# Patient Record
Sex: Male | Born: 1990 | Hispanic: Yes | Marital: Single | Smoking: Current some day smoker
Health system: Southern US, Community
[De-identification: ages and names within clinical notes are randomized; demographics above are authoritative.]

## PROBLEM LIST (undated history)

## (undated) DIAGNOSIS — Z789 Other specified health status: Secondary | ICD-10-CM

---

## 2020-05-16 ENCOUNTER — Inpatient Hospital Stay (HOSPITAL_COMMUNITY)
Admission: EM | Admit: 2020-05-16 | Discharge: 2020-05-20 | DRG: 372 | Disposition: A | Discharge status: Home / Self Care | Payer: Self-pay | Attending: Physician Assistant | Admitting: Physician Assistant

## 2020-05-16 ENCOUNTER — Emergency Department (HOSPITAL_COMMUNITY): Payer: Self-pay

## 2020-05-16 ENCOUNTER — Encounter (HOSPITAL_COMMUNITY): Payer: Self-pay | Admitting: Emergency Medicine

## 2020-05-16 DIAGNOSIS — L0291 Cutaneous abscess, unspecified: Secondary | ICD-10-CM

## 2020-05-16 DIAGNOSIS — K651 Peritoneal abscess: Secondary | ICD-10-CM

## 2020-05-16 DIAGNOSIS — K3533 Acute appendicitis with perforation and localized peritonitis, with abscess: Principal | ICD-10-CM | POA: Diagnosis present

## 2020-05-16 DIAGNOSIS — K3532 Acute appendicitis with perforation and localized peritonitis, without abscess: Secondary | ICD-10-CM | POA: Diagnosis present

## 2020-05-16 DIAGNOSIS — K659 Peritonitis, unspecified: Secondary | ICD-10-CM

## 2020-05-16 DIAGNOSIS — N179 Acute kidney failure, unspecified: Secondary | ICD-10-CM | POA: Diagnosis present

## 2020-05-16 DIAGNOSIS — K5900 Constipation, unspecified: Secondary | ICD-10-CM | POA: Diagnosis present

## 2020-05-16 DIAGNOSIS — Z20822 Contact with and (suspected) exposure to covid-19: Secondary | ICD-10-CM | POA: Diagnosis present

## 2020-05-16 DIAGNOSIS — F17200 Nicotine dependence, unspecified, uncomplicated: Secondary | ICD-10-CM | POA: Diagnosis present

## 2020-05-16 HISTORY — DX: Acute appendicitis with perforation, localized peritonitis, and gangrene, without abscess: K35.32

## 2020-05-16 LAB — CBC
HCT: 45.9 % (ref 39.0–52.0)
Hemoglobin: 15.6 g/dL (ref 13.0–17.0)
MCH: 29.2 pg (ref 26.0–34.0)
MCHC: 34 g/dL (ref 30.0–36.0)
MCV: 85.8 fL (ref 80.0–100.0)
Platelets: 317 10*3/uL (ref 150–400)
RBC: 5.35 MIL/uL (ref 4.22–5.81)
RDW: 12.6 % (ref 11.5–15.5)
WBC: 17.2 10*3/uL — ABNORMAL HIGH (ref 4.0–10.5)
nRBC: 0 % (ref 0.0–0.2)

## 2020-05-16 LAB — COMPREHENSIVE METABOLIC PANEL
ALT: 36 U/L (ref 0–44)
AST: 27 U/L (ref 15–41)
Albumin: 3.7 g/dL (ref 3.5–5.0)
Alkaline Phosphatase: 76 U/L (ref 38–126)
Anion gap: 16 — ABNORMAL HIGH (ref 5–15)
BUN: 34 mg/dL — ABNORMAL HIGH (ref 6–20)
CO2: 23 mmol/L (ref 22–32)
Calcium: 9 mg/dL (ref 8.9–10.3)
Chloride: 92 mmol/L — ABNORMAL LOW (ref 98–111)
Creatinine, Ser: 1.62 mg/dL — ABNORMAL HIGH (ref 0.61–1.24)
GFR calc Af Amer: >60 mL/min (ref >60)
GFR calc non Af Amer: 57 mL/min — ABNORMAL LOW (ref >60)
Glucose, Bld: 163 mg/dL — ABNORMAL HIGH (ref 70–99)
Potassium: 3.8 mmol/L (ref 3.5–5.1)
Sodium: 131 mmol/L — ABNORMAL LOW (ref 135–145)
Total Bilirubin: 2.4 mg/dL — ABNORMAL HIGH (ref 0.3–1.2)
Total Protein: 7.7 g/dL (ref 6.5–8.1)

## 2020-05-16 LAB — LACTIC ACID, PLASMA: Lactic Acid, Venous: 1.8 mmol/L (ref 0.5–1.9)

## 2020-05-16 LAB — LIPASE, BLOOD: Lipase: 18 U/L (ref 11–51)

## 2020-05-16 LAB — SARS CORONAVIRUS 2 BY RT PCR (HOSPITAL ORDER, PERFORMED IN ~~LOC~~ HOSPITAL LAB): SARS Coronavirus 2: NEGATIVE

## 2020-05-16 MED ORDER — HYDROMORPHONE HCL 1 MG/ML IJ SOLN
0.5000 mg | Freq: Once | INTRAMUSCULAR | Status: AC
  Administered 2020-05-16: 0.5 mg via INTRAVENOUS
  Filled 2020-05-16: qty 1

## 2020-05-16 MED ORDER — POTASSIUM CHLORIDE IN NACL 20-0.9 MEQ/L-% IV SOLN
INTRAVENOUS | Status: DC
  Filled 2020-05-16 (×9): qty 1000

## 2020-05-16 MED ORDER — LACTATED RINGERS IV BOLUS
1000.0000 mL | Freq: Once | INTRAVENOUS | Status: AC
  Administered 2020-05-16: 1000 mL via INTRAVENOUS

## 2020-05-16 MED ORDER — SODIUM CHLORIDE 0.9 % IV BOLUS
1000.0000 mL | Freq: Once | INTRAVENOUS | Status: AC
  Administered 2020-05-16: 1000 mL via INTRAVENOUS

## 2020-05-16 MED ORDER — IOHEXOL 300 MG/ML  SOLN
100.0000 mL | Freq: Once | INTRAMUSCULAR | Status: AC | PRN
  Administered 2020-05-16: 100 mL via INTRAVENOUS

## 2020-05-16 MED ORDER — OXYCODONE HCL 5 MG PO TABS: 5.0000 mg | ORAL_TABLET | ORAL | Status: DC | PRN

## 2020-05-16 MED ORDER — PANTOPRAZOLE SODIUM 40 MG IV SOLR
40.0000 mg | Freq: Every day | INTRAVENOUS | Status: DC
  Administered 2020-05-17 – 2020-05-18 (×3): 40 mg via INTRAVENOUS
  Filled 2020-05-16 (×3): qty 40

## 2020-05-16 MED ORDER — PIPERACILLIN-TAZOBACTAM 3.375 G IVPB 30 MIN
3.3750 g | Freq: Once | INTRAVENOUS | Status: AC
  Administered 2020-05-16: 3.375 g via INTRAVENOUS
  Filled 2020-05-16: qty 50

## 2020-05-16 MED ORDER — ACETAMINOPHEN 650 MG RE SUPP: 650.0000 mg | Freq: Four times a day (QID) | RECTAL | Status: DC | PRN

## 2020-05-16 MED ORDER — SODIUM CHLORIDE 0.9% FLUSH
3.0000 mL | Freq: Once | INTRAVENOUS | Status: AC
  Administered 2020-05-17: 3 mL via INTRAVENOUS

## 2020-05-16 MED ORDER — ONDANSETRON HCL 4 MG/2ML IJ SOLN: 4.0000 mg | Freq: Four times a day (QID) | INTRAMUSCULAR | Status: DC | PRN

## 2020-05-16 MED ORDER — ONDANSETRON 4 MG PO TBDP: 4.0000 mg | ORAL_TABLET | Freq: Four times a day (QID) | ORAL | Status: DC | PRN

## 2020-05-16 MED ORDER — ACETAMINOPHEN 325 MG PO TABS
650.0000 mg | ORAL_TABLET | Freq: Four times a day (QID) | ORAL | Status: DC | PRN
  Administered 2020-05-17: 650 mg via ORAL
  Filled 2020-05-16: qty 2

## 2020-05-16 MED ORDER — PIPERACILLIN-TAZOBACTAM 3.375 G IVPB
3.3750 g | Freq: Three times a day (TID) | INTRAVENOUS | Status: DC
  Administered 2020-05-17 – 2020-05-20 (×10): 3.375 g via INTRAVENOUS
  Filled 2020-05-16 (×10): qty 50

## 2020-05-16 MED ORDER — METHOCARBAMOL 500 MG PO TABS: 500.0000 mg | ORAL_TABLET | Freq: Four times a day (QID) | ORAL | Status: DC | PRN

## 2020-05-16 MED ORDER — ALBUMIN HUMAN 5 % IV SOLN
25.0000 g | Freq: Once | INTRAVENOUS | Status: AC
  Administered 2020-05-17: 25 g via INTRAVENOUS
  Filled 2020-05-16: qty 500

## 2020-05-16 MED ORDER — MORPHINE SULFATE (PF) 4 MG/ML IV SOLN
4.0000 mg | INTRAVENOUS | Status: DC | PRN
  Administered 2020-05-17 (×2): 4 mg via INTRAVENOUS
  Filled 2020-05-16 (×2): qty 1

## 2020-05-16 MED ORDER — ONDANSETRON HCL 4 MG/2ML IJ SOLN
4.0000 mg | Freq: Once | INTRAMUSCULAR | Status: AC
  Administered 2020-05-16: 4 mg via INTRAVENOUS
  Filled 2020-05-16: qty 2

## 2020-05-16 NOTE — H&P (Signed)
Jonathan Hampton is an 29 y.o. male.   Chief Complaint: R abdominal pain, vomiting HPI: 29yo M presented to the emergency department complaining of right-sided abdominal pain and vomiting for 48 hours.  The abdominal pain was initially centralized and then moved more to the right side.  He has not been able to eat or drink very much.  Evaluation in the emergency department showed tachycardia, leukocytosis of 17,200, acute kidney injury with creatinine 1.62, and CT scan of the abdomen pelvis consistent with perforated appendicitis with intra-abdominal abscess.  I was asked to see him for admission.  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  No previous surgeries.  History reviewed. No pertinent past medical history.  History reviewed. No pertinent surgical history.  No family history on file. Social History:  reports that he has been smoking. He has never used smokeless tobacco. He reports current alcohol use. He reports previous drug use.  Allergies: No Known Allergies  (Not in a hospital admission)   Results for orders placed or performed during the hospital encounter of 05/16/20 (from the past 48 hour(s))  Lipase, blood     Status: None   Collection Time: 05/16/20  7:36 PM  Result Value Ref Range   Lipase 18 11 - 51 U/L    Comment: Performed at [facility], [address], [place], [place] [address]  Comprehensive metabolic panel     Status: Abnormal   Collection Time: 05/16/20  7:36 PM  Result Value Ref Range   Sodium 131 (L) 135 - 145 mmol/L   Potassium 3.8 3.5 - 5.1 mmol/L   Chloride 92 (L) 98 - 111 mmol/L   CO2 23 22 - 32 mmol/L   Glucose, Bld 163 (H) 70 - 99 mg/dL    Comment: Glucose reference range applies only to samples taken after fasting for at least 8 hours.   BUN 34 (H) 6 - 20 mg/dL   Creatinine, Ser 0.09 (H) 0.61 - 1.24 mg/dL   Calcium 9.0 8.9 - 38.1 mg/dL   Total Protein 7.7 6.5 - 8.1 g/dL   Albumin 3.7 3.5 - 5.0 g/dL   AST 27 15 - 41  U/L   ALT 36 0 - 44 U/L   Alkaline Phosphatase 76 38 - 126 U/L   Total Bilirubin 2.4 (H) 0.3 - 1.2 mg/dL   GFR calc non Af Amer 57 (L) >60 mL/min   GFR calc Af Amer >60 >60 mL/min   Anion gap 16 (H) 5 - 15    Comment: Performed at [facility], [address] [place], [place] [address]  CBC     Status: Abnormal   Collection Time: 05/16/20  7:36 PM  Result Value Ref Range   WBC 17.2 (H) 4.0 - 10.5 K/uL   RBC 5.35 4.22 - 5.81 MIL/uL   Hemoglobin 15.6 13.0 - 17.0 g/dL   HCT 71.6 39 - 52 %   MCV 85.8 80.0 - 100.0 fL   MCH 29.2 26.0 - 34.0 pg   MCHC 34.0 30.0 - 36.0 g/dL   RDW 96.7 89.3 - 81.0 %   Platelets 317 150 - 400 K/uL   nRBC 0.0 0.0 - 0.2 %    Comment: Performed at [facility], [address], [place], [place] [address]  Lactic acid, plasma     Status: None   Collection Time: 05/16/20  8:01 PM  Result Value Ref Range   Lactic Acid, Venous  1.8 0.5 - 1.9 mmol/L    Comment: Performed at [facility], [address] [place], [place] [address]   CT ABDOMEN PELVIS W CONTRAST  Result Date: 05/16/2020 CLINICAL DATA:  Abdominal pain. EXAM: CT ABDOMEN AND PELVIS WITH CONTRAST TECHNIQUE: Multidetector CT imaging of the abdomen and pelvis was performed using the standard protocol following bolus administration of intravenous contrast. CONTRAST:  123mL OMNIPAQUE IOHEXOL 300 MG/ML  SOLN COMPARISON:  None. FINDINGS: Lower chest: There is a small right-sided pleural effusion.There is atelectasis at the lung bases. Hepatobiliary: The liver is normal. Normal gallbladder.There is no biliary ductal dilation. Pancreas: Normal contours without ductal dilatation. No peripancreatic fluid collection. Spleen: Unremarkable. Adrenals/Urinary Tract: --Adrenal glands: Unremarkable. --Right kidney/ureter: No hydronephrosis or radiopaque kidney stones. --Left kidney/ureter: No hydronephrosis or radiopaque kidney stones. --Urinary bladder: Unremarkable. Stomach/Bowel: --Stomach/Duodenum: There  is some mild diffuse wall thickening of the distal esophagus. The visualized portions of the stomach are unremarkable. --Small bowel: There are mildly dilated loops of small bowel scattered throughout the abdomen. --Colon: There is mild wall thickening of the ascending colon, likely reactive in etiology. --Appendix: There are findings of perforated acute appendicitis. There is a forming abscess along the inferior margin of the right hepatic lobe measuring approximately 8.2 by 2.8 cm (coronal series 6, image 64). There are pockets of free air in the upper abdomen. There is a small amount of free fluid in the abdomen and pelvis. Vascular/Lymphatic: Normal course and caliber of the major abdominal vessels. --No retroperitoneal lymphadenopathy. --No mesenteric lymphadenopathy. --No pelvic or inguinal lymphadenopathy. Reproductive: Unremarkable Other: Free fluid and free air is noted in the abdomen and pelvis. The abdominal wall is normal. Musculoskeletal. 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 There is no acute displaced fracture. IMPRESSION: 1. Findings consistent with perforated acute appendicitis. There is an 8 cm abscess at the inferior margin of the right hepatic lobe. There is free air and free fluid in the abdomen and pelvis. 2. Small right-sided pleural effusion with adjacent atelectasis. 3. Low-grade small-bowel ileus. 4. Wall thickening of the cecum and ascending colon, likely reactive. 5. 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 These results were called by telephone at the time of interpretation on 05/16/2020 at 9:54 pm to provider [facility] , who verbally acknowledged these results. Electronically Signed   By: Constance Holster M.D.   On: 05/16/2020 21:58   DG Chest Port 1 View  Result Date: 05/16/2020 CLINICAL DATA:  Abdominal pain and distension.  Tachycardia. EXAM: PORTABLE CHEST 1 VIEW COMPARISON:  None. FINDINGS: Shallow  lung inflation with bibasilar atelectasis. No focal airspace consolidation. No pulmonary edema or pleural effusion. Normal cardiomediastinal contours. No pneumothorax. IMPRESSION: Shallow lung inflation with bibasilar atelectasis. Electronically Signed   By: Ulyses Jarred M.D.   On: 05/16/2020 20:41    Review of Systems  Constitutional: Positive for appetite change and fatigue.  HENT: Negative.   Eyes: Negative.   Respiratory: Negative.   Cardiovascular: Negative.   Gastrointestinal: Positive for abdominal pain, nausea and vomiting. Negative for diarrhea.  Endocrine: Negative.   Musculoskeletal: Negative.   Skin: Negative.   Allergic/Immunologic: Negative.   Neurological: Negative.   Hematological: Negative.   Psychiatric/Behavioral: Negative.     Blood pressure 129/84, pulse (!) 130, temperature 98.4 F (36.9 C), temperature source Oral, resp. rate (!) 28, SpO2 94 %. Physical Exam  Vitals reviewed. Constitutional: He is oriented to person, place,  and time. He appears well-developed.  HENT:  Head: Normocephalic.  Mouth/Throat: Mucous membranes are moist.  Eyes: Pupils are equal, round, and reactive to light.  Cardiovascular: Regular rhythm and normal heart sounds. Tachycardia present.  No murmur heard. Respiratory: Effort normal and breath sounds normal. No respiratory distress. He has no wheezes. He has no rales.  GI: Bowel sounds are decreased. There is no splenomegaly or hepatomegaly. There is abdominal tenderness in the right upper quadrant and right lower quadrant. No hernia.  Neurological: He is alert and oriented to person, place, and time. He displays no weakness.  Skin: Skin is warm and dry. Capillary refill takes less than 2 seconds. No rash noted.  Psychiatric: Mood normal.     Assessment/Plan Perforated appendicitis with intra-abdominal abscess SIRS Acute kidney injury  Admit to progressive unit, Zosyn IV, NPO, further aggressive volume resuscitation, IR to drain  abscess  I discussed the plan in detail with him and answered his questions.  Liz Malady, MD 05/16/2020, 10:57 PM

## 2020-05-16 NOTE — ED Provider Notes (Signed)
She has [facility] EMERGENCY DEPARTMENT Provider Note   CSN: [id number] Arrival date & time: 05/16/20  1761     History Chief Complaint  Patient presents with  . Abdominal Pain    Jonathan Hampton is a 29 y.o. male.  Patient with no past surgical history presents to the emergency department for abdominal pain, vomiting, constipation.  History taken using video interpreter.  Patient reports 2 to 3 days of severe acid burning in his chest with associated vomiting whenever he tries to eat or drink.  He has not been able to eat any solids and whenever he tries to drink juice he vomits.  He denies blood in the vomit.  Abdominal pain is worse with movement and palpation.  It is severe at times.  Patient reports constipation.  He denies history of chronic alcohol use, heavy NSAID use.  He denies history of peptic ulcer disease.  No history of abdominal surgeries.  He denies take any medications at home.  He reports decreased urine, no hematuria or dysuria.  He states that his abdomen feels bloated.        History reviewed. No pertinent past medical history.  There are no problems to display for this patient.   History reviewed. No pertinent surgical history.     No family history on file.  Social History   Tobacco Use  . Smoking status: Current Every Day Smoker  . Smokeless tobacco: Never Used  Substance Use Topics  . Alcohol use: Yes  . Drug use: Not Currently    Home Medications Prior to Admission medications   Not on File    Allergies    Patient has no known allergies.  Review of Systems   Review of Systems  Constitutional: Negative for fever.  HENT: Negative for rhinorrhea and sore throat.   Eyes: Negative for redness.  Respiratory: Negative for cough and shortness of breath.   Cardiovascular: Negative for chest pain.  Gastrointestinal: Positive for abdominal pain, constipation, nausea and vomiting. Negative for blood in stool and  diarrhea.  Genitourinary: Negative for dysuria.  Musculoskeletal: Negative for myalgias.  Skin: Negative for rash.  Neurological: Negative for headaches.    Physical Exam Updated Vital Signs BP 124/78   Pulse (!) 159   Temp 98.4 F (36.9 C) (Oral)   Resp 18   SpO2 94%   Physical Exam Vitals and nursing note reviewed.  Constitutional:      Appearance: He is well-developed.  HENT:     Head: Normocephalic and atraumatic.  Eyes:     General:        Right eye: No discharge.        Left eye: No discharge.     Conjunctiva/sclera: Conjunctivae normal.  Cardiovascular:     Rate and Rhythm: Regular rhythm. Tachycardia present.     Heart sounds: Normal heart sounds.     Comments: HR 150-160 Pulmonary:     Effort: Pulmonary effort is normal.     Breath sounds: Normal breath sounds.  Abdominal:     Palpations: Abdomen is soft.     Tenderness: There is generalized abdominal tenderness. There is guarding and rebound. Negative signs include Murphy's sign and McBurney's sign.     Hernia: No hernia is present.  Musculoskeletal:     Cervical back: Normal range of motion and neck supple.  Skin:    General: Skin is warm and dry.  Neurological:     Mental Status: He is alert.  ED Results / Procedures / Treatments   Labs (all labs ordered are listed, but only abnormal results are displayed) Labs Reviewed  COMPREHENSIVE METABOLIC PANEL - Abnormal; Notable for the following components:      Result Value   Sodium 131 (*)    Chloride 92 (*)    Glucose, Bld 163 (*)    BUN 34 (*)    Creatinine, Ser 1.62 (*)    Total Bilirubin 2.4 (*)    GFR calc non Af Amer 57 (*)    Anion gap 16 (*)    All other components within normal limits  CBC - Abnormal; Notable for the following components:   WBC 17.2 (*)    All other components within normal limits  SARS CORONAVIRUS 2 BY RT PCR (HOSPITAL ORDER, [facility])  LIPASE, BLOOD  LACTIC ACID, PLASMA  URINALYSIS,  ROUTINE W REFLEX MICROSCOPIC    ED ECG REPORT   Date: 05/16/2020  Rate: 158  Rhythm: sinus tachycardia  QRS Axis: normal  Intervals: normal  ST/T Wave abnormalities: normal  Conduction Disutrbances:none  Narrative Interpretation:   Old EKG Reviewed: none available  I have personally reviewed the EKG tracing and agree with the computerized printout as noted.  Radiology CT ABDOMEN PELVIS W CONTRAST  Result Date: 05/16/2020 CLINICAL DATA:  Abdominal pain. EXAM: CT ABDOMEN AND PELVIS WITH CONTRAST TECHNIQUE: Multidetector CT imaging of the abdomen and pelvis was performed using the standard protocol following bolus administration of intravenous contrast. CONTRAST:  158mL OMNIPAQUE IOHEXOL 300 MG/ML  SOLN COMPARISON:  None. FINDINGS: Lower chest: There is a small right-sided pleural effusion.There is atelectasis at the lung bases. Hepatobiliary: The liver is normal. Normal gallbladder.There is no biliary ductal dilation. Pancreas: Normal contours without ductal dilatation. No peripancreatic fluid collection. Spleen: Unremarkable. Adrenals/Urinary Tract: --Adrenal glands: Unremarkable. --Right kidney/ureter: No hydronephrosis or radiopaque kidney stones. --Left kidney/ureter: No hydronephrosis or radiopaque kidney stones. --Urinary bladder: Unremarkable. Stomach/Bowel: --Stomach/Duodenum: There is some mild diffuse wall thickening of the distal esophagus. The visualized portions of the stomach are unremarkable. --Small bowel: There are mildly dilated loops of small bowel scattered throughout the abdomen. --Colon: There is mild wall thickening of the ascending colon, likely reactive in etiology. --Appendix: There are findings of perforated acute appendicitis. There is a forming abscess along the inferior margin of the right hepatic lobe measuring approximately 8.2 by 2.8 cm (coronal series 6, image 64). There are pockets of free air in the upper abdomen. There is a small amount of free fluid in the  abdomen and pelvis. Vascular/Lymphatic: Normal course and caliber of the major abdominal vessels. --No retroperitoneal lymphadenopathy. --No mesenteric lymphadenopathy. --No pelvic or inguinal lymphadenopathy. Reproductive: Unremarkable Other: Free fluid and free air is noted in the abdomen and pelvis. The abdominal wall is normal. Musculoskeletal. 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 There is no acute displaced fracture. IMPRESSION: 1. Findings consistent with perforated acute appendicitis. There is an 8 cm abscess at the inferior margin of the right hepatic lobe. There is free air and free fluid in the abdomen and pelvis. 2. Small right-sided pleural effusion with adjacent atelectasis. 3. Low-grade small-bowel ileus. 4. Wall thickening of the cecum and ascending colon, likely reactive. 5. 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 These results were called by telephone at the time of interpretation on 05/16/2020 at 9:54 pm to provider [facility] , who verbally acknowledged these results.  Electronically Signed   By: Katherine Mantle M.D.   On: 05/16/2020 21:58   DG Chest Port 1 View  Result Date: 05/16/2020 CLINICAL DATA:  Abdominal pain and distension.  Tachycardia. EXAM: PORTABLE CHEST 1 VIEW COMPARISON:  None. FINDINGS: Shallow lung inflation with bibasilar atelectasis. No focal airspace consolidation. No pulmonary edema or pleural effusion. Normal cardiomediastinal contours. No pneumothorax. IMPRESSION: Shallow lung inflation with bibasilar atelectasis. Electronically Signed   By: Deatra Robinson M.D.   On: 05/16/2020 20:41    Procedures Procedures (including critical care time)  Medications Ordered in ED Medications  sodium chloride flush (NS) 0.9 % injection 3 mL (has no administration in time range)  piperacillin-tazobactam (ZOSYN) IVPB 3.375 g (3.375 g Intravenous New Bag/Given 05/16/20 2206)  lactated ringers bolus  1,000 mL (has no administration in time range)  piperacillin-tazobactam (ZOSYN) IVPB 3.375 g (has no administration in time range)  sodium chloride 0.9 % bolus 1,000 mL (0 mLs Intravenous Stopped 05/16/20 2207)  sodium chloride 0.9 % bolus 1,000 mL (0 mLs Intravenous Stopped 05/16/20 2059)  HYDROmorphone (DILAUDID) injection 0.5 mg (0.5 mg Intravenous Given 05/16/20 2013)  ondansetron (ZOFRAN) injection 4 mg (4 mg Intravenous Given 05/16/20 2012)  iohexol (OMNIPAQUE) 300 MG/ML solution 100 mL (100 mLs Intravenous Contrast Given 05/16/20 2137)    ED Course  I have reviewed the triage vital signs and the nursing notes.  Pertinent labs & imaging results that were available during my care of the patient were reviewed by me and considered in my medical decision making (see chart for details).  Patient seen and examined. Work-up initiated. Medications ordered.  Will obtain chest x-ray.  Patient will require CT imaging of the abdomen given severe pain to rule out infection/perforation.  Vital signs reviewed and are as follows: BP 124/78   Pulse (!) 159   Temp 98.4 F (36.9 C) (Oral)   Resp 18   SpO2 94%   10:14 PM CT images reviewed.  I have spoken with radiologist regarding findings.  There his free air from a perforated appendicitis with developing abscess.    Patient updated on results.  Heart rate improved to 130 after 2 liters NS.  Additional liter of LR ordered. Zosyn ordered. Lactate is normal.   Requested consult from Dr. Janee Morn of general surgery who will see.  CRITICAL CARE Performed by: Renne Crigler PA-C Total critical care time: 35 minutes Critical care time was exclusive of separately billable procedures and treating other patients. Critical care was necessary to treat or prevent imminent or life-threatening deterioration. Critical care was time spent personally by me on the following activities: development of treatment plan with patient and/or surrogate as well as nursing,  discussions with consultants, evaluation of patient's response to treatment, examination of patient, obtaining history from patient or surrogate, ordering and performing treatments and interventions, ordering and review of laboratory studies, ordering and review of radiographic studies, pulse oximetry and re-evaluation of patient's condition.      MDM Rules/Calculators/A&P                          Admit.    Final Clinical Impression(s) / ED Diagnoses Final diagnoses:  Acute perforated appendicitis  Peritonitis (HCC)  Acute kidney injury [facility])    Rx / DC Orders ED Discharge Orders    None       Renne Crigler, Cordelia Poche 05/16/20 2217    Sabas Sous, MD 05/17/20 0008

## 2020-05-16 NOTE — Progress Notes (Signed)
Pharmacy Antibiotic Note  [facility] Jonathan Hampton is a 29 y.o. male admitted on 05/16/2020 with abdominal pain.  Pharmacy has been consulted for zosyn dosing for perforated appendicitis. Pt is afebrile and WBC is elevated. SCr is elevated but lactic acid is <2.   Plan: Zosyn 3.375gm IV Q8H (4 hr inf) F/u renal fxn, C&S, clinical status *Pharmacy will sign off and only follow peripherally as needed as no further dose adjustments are anticipated.     Temp (24hrs), Avg:98.4 F (36.9 C), Min:98.4 F (36.9 C), Max:98.4 F (36.9 C)  Recent Labs  Lab 05/16/20 1936 05/16/20 2001  WBC 17.2*  --   CREATININE 1.62*  --   LATICACIDVEN  --  1.8    CrCl cannot be calculated (Unknown ideal weight.).    No Known Allergies  Thank you for allowing pharmacy to be a part of this patient's care.  Zea Kostka, Drake Leach 05/16/2020 10:02 PM

## 2020-05-16 NOTE — ED Triage Notes (Signed)
Pt c/o generalized abd pain, emesis for several days with no BM x 3 days, small amount watery stool today.  Pt c/o severe GERD. HR 159 in triage. Pt is spanish speaking

## 2020-05-16 NOTE — ED Notes (Signed)
Attempted report x1. 

## 2020-05-17 ENCOUNTER — Encounter (HOSPITAL_COMMUNITY): Payer: Self-pay | Admitting: General Surgery

## 2020-05-17 ENCOUNTER — Inpatient Hospital Stay (HOSPITAL_COMMUNITY): Payer: Self-pay

## 2020-05-17 ENCOUNTER — Other Ambulatory Visit: Payer: Self-pay

## 2020-05-17 LAB — BASIC METABOLIC PANEL
Anion gap: 12 (ref 5–15)
BUN: 22 mg/dL — ABNORMAL HIGH (ref 6–20)
CO2: 26 mmol/L (ref 22–32)
Calcium: 8.2 mg/dL — ABNORMAL LOW (ref 8.9–10.3)
Chloride: 98 mmol/L (ref 98–111)
Creatinine, Ser: 1.23 mg/dL (ref 0.61–1.24)
GFR calc Af Amer: >60 mL/min (ref >60)
GFR calc non Af Amer: >60 mL/min (ref >60)
Glucose, Bld: 113 mg/dL — ABNORMAL HIGH (ref 70–99)
Potassium: 4.1 mmol/L (ref 3.5–5.1)
Sodium: 136 mmol/L (ref 135–145)

## 2020-05-17 LAB — CBC
HCT: 38.1 % — ABNORMAL LOW (ref 39.0–52.0)
Hemoglobin: 12.7 g/dL — ABNORMAL LOW (ref 13.0–17.0)
MCH: 29.4 pg (ref 26.0–34.0)
MCHC: 33.3 g/dL (ref 30.0–36.0)
MCV: 88.2 fL (ref 80.0–100.0)
Platelets: 239 10*3/uL (ref 150–400)
RBC: 4.32 MIL/uL (ref 4.22–5.81)
RDW: 12.8 % (ref 11.5–15.5)
WBC: 12 10*3/uL — ABNORMAL HIGH (ref 4.0–10.5)
nRBC: 0 % (ref 0.0–0.2)

## 2020-05-17 LAB — PROTIME-INR
INR: 1.4 — ABNORMAL HIGH (ref 0.8–1.2)
Prothrombin Time: 16.5 seconds — ABNORMAL HIGH (ref 11.4–15.2)

## 2020-05-17 LAB — HIV ANTIBODY (ROUTINE TESTING W REFLEX): HIV Screen 4th Generation wRfx: NONREACTIVE

## 2020-05-17 MED ORDER — FENTANYL CITRATE (PF) 100 MCG/2ML IJ SOLN
INTRAMUSCULAR | Status: AC
  Filled 2020-05-17: qty 2

## 2020-05-17 MED ORDER — MIDAZOLAM HCL 2 MG/2ML IJ SOLN
INTRAMUSCULAR | Status: AC
  Filled 2020-05-17: qty 2

## 2020-05-17 MED ORDER — FENTANYL CITRATE (PF) 100 MCG/2ML IJ SOLN
INTRAMUSCULAR | Status: AC | PRN
  Administered 2020-05-17 (×2): 25 ug via INTRAVENOUS
  Administered 2020-05-17: 50 ug via INTRAVENOUS
  Administered 2020-05-17 (×2): 25 ug via INTRAVENOUS

## 2020-05-17 MED ORDER — MIDAZOLAM HCL 2 MG/2ML IJ SOLN
INTRAMUSCULAR | Status: AC | PRN
  Administered 2020-05-17 (×3): 0.5 mg via INTRAVENOUS
  Administered 2020-05-17: 1 mg via INTRAVENOUS
  Administered 2020-05-17: 0.5 mg via INTRAVENOUS

## 2020-05-17 MED ORDER — LIDOCAINE HCL 1 % IJ SOLN
INTRAMUSCULAR | Status: AC
  Filled 2020-05-17: qty 20

## 2020-05-17 MED ORDER — BOOST / RESOURCE BREEZE PO LIQD CUSTOM
1.0000 | Freq: Three times a day (TID) | ORAL | Status: DC
  Administered 2020-05-17 – 2020-05-19 (×2): 1 via ORAL

## 2020-05-17 NOTE — Procedures (Signed)
Interventional Radiology Procedure Note  Procedure:  1.) Placement of 4F drain into subhepatic abscess with removal of 80 mL foul smelling purulent fluid.  2.) Placement of 4F drain in to pelvic cul-de-sac collection from LEFT transgluteal approach. 40 mL foul smelling fluid.   Complications: None  Estimated Blood Loss: None  Recommendations: - Drains to JP bulbs - Cultures sent - Flush Q shift - Follow output  Signed,  Sterling Big, MD

## 2020-05-17 NOTE — H&P (Signed)
Chief Complaint: Patient was seen in consultation today for  Chief Complaint  Patient presents with  . Abdominal Pain    Referring Physician(s): Dr. Violeta Gelinas  Supervising Physician: Malachy Moan  Patient Status: [facility] - In-pt  History of Present Illness: Jonathan Hampton is a 29 y.o. male with no past medical history who presented to the [facility] ED 6/10/2021with abdominal pain, vomiting and constipation. Evaluation in the ED showed tachycardia, leukocytosis (17,200), and acute kidney injury with a creatinine of 1.62.  CT abdomen/pelvis 05/16/20: Findings consistent with perforated acute appendicitis. There is an 8 cm abscess at the inferior margin of the right hepatic lobe. There is free air and free fluid in the abdomen and pelvis.  Interventional Radiology has been asked to evaluate this patient for an image-guided intra-abdominal fluid aspiration with possible drain(s) placement.   History reviewed. No pertinent past medical history.  History reviewed. No pertinent surgical history.  Allergies: Patient has no known allergies.  Medications: Prior to Admission medications   Not on File     History reviewed. No pertinent family history.  Social History   Socioeconomic History  . Marital status: Single    Spouse name: Not on file  . Number of children: Not on file  . Years of education: Not on file  . Highest education level: Not on file  Occupational History  . Not on file  Tobacco Use  . Smoking status: Current Every Day Smoker  . Smokeless tobacco: Never Used  Substance and Sexual Activity  . Alcohol use: Yes  . Drug use: Not Currently  . Sexual activity: Not on file  Other Topics Concern  . Not on file  Social History Narrative  . Not on file   Social Determinants of Health   Financial Resource Strain:   . Difficulty of Paying Living Expenses:   Food Insecurity:   . Worried About Programme researcher, broadcasting/film/video in the Last Year:   . Occupational psychologist in the Last Year:   Transportation Needs:   . Freight forwarder (Medical):   Marland Kitchen Lack of Transportation (Non-Medical):   Physical Activity:   . Days of Exercise per Week:   . Minutes of Exercise per Session:   Stress:   . Feeling of Stress :   Social Connections:   . Frequency of Communication with Friends and Family:   . Frequency of Social Gatherings with Friends and Family:   . Attends Religious Services:   . Active Member of Clubs or Organizations:   . Attends Banker Meetings:   Marland Kitchen Marital Status:     Review of Systems: A 12 point ROS discussed and pertinent positives are indicated in the HPI above.  All other systems are negative.  Review of Systems  Constitutional: Positive for appetite change.       Patient has not had an appetite/has not eaten for 3 days  Respiratory: Negative for cough and shortness of breath.   Cardiovascular: Negative for chest pain.  Gastrointestinal: Positive for abdominal distention, abdominal pain, constipation and nausea.  Musculoskeletal: Negative for back pain.  Neurological: Negative for headaches.    Vital Signs: BP 122/74   Pulse (!) 124   Temp 98.2 F (36.8 C) (Tympanic)   Resp 20   Wt 197 lb 15.6 oz (89.8 kg)   SpO2 93%   Physical Exam Constitutional:      General: He is not in acute distress.    Appearance:  He is well-developed.  HENT:     Mouth/Throat:     Mouth: Mucous membranes are moist.  Cardiovascular:     Rate and Rhythm: Regular rhythm. Tachycardia present.     Pulses: Normal pulses.     Heart sounds: Normal heart sounds.  Pulmonary:     Effort: Pulmonary effort is normal.     Breath sounds: Normal breath sounds.  Abdominal:     General: Bowel sounds are normal. There is distension.     Tenderness: There is generalized abdominal tenderness.     Comments: Tenderness most pronounced on the right side.  Skin:    General: Skin is warm and dry.  Neurological:     Mental Status: He is  alert and oriented to person, place, and time.     Imaging: CT ABDOMEN PELVIS W CONTRAST  Result Date: 05/16/2020 CLINICAL DATA:  Abdominal pain. EXAM: CT ABDOMEN AND PELVIS WITH CONTRAST TECHNIQUE: Multidetector CT imaging of the abdomen and pelvis was performed using the standard protocol following bolus administration of intravenous contrast. CONTRAST:  OMNIPAQUE IOHEXOL 300 MG/ML  SOLN COMPARISON:  None. FINDINGS: Lower chest: There is a small right-sided pleural effusion.There is atelectasis at the lung bases. Hepatobiliary: The liver is normal. Normal gallbladder.There is no biliary ductal dilation. Pancreas: Normal contours without ductal dilatation. No peripancreatic fluid collection. Spleen: Unremarkable. Adrenals/Urinary Tract: --Adrenal glands: Unremarkable. --Right kidney/ureter: No hydronephrosis or radiopaque kidney stones. --Left kidney/ureter: No hydronephrosis or radiopaque kidney stones. --Urinary bladder: Unremarkable. Stomach/Bowel: --Stomach/Duodenum: There is some mild diffuse wall thickening of the distal esophagus. The visualized portions of the stomach are unremarkable. --Small bowel: There are mildly dilated loops of small bowel scattered throughout the abdomen. --Colon: There is mild wall thickening of the ascending colon, likely reactive in etiology. --Appendix: There are findings of perforated acute appendicitis. There is a forming abscess along the inferior margin of the right hepatic lobe measuring approximately 8.2 by 2.8 cm (coronal series 6, image 64). There are pockets of free air in the upper abdomen. There is a small amount of free fluid in the abdomen and pelvis. Vascular/Lymphatic: Normal course and caliber of the major abdominal vessels. --No retroperitoneal lymphadenopathy. --No mesenteric lymphadenopathy. --No pelvic or inguinal lymphadenopathy. Reproductive: Unremarkable Other: Free fluid and free air is noted in the abdomen and pelvis. The abdominal wall is  normal. Musculoskeletal. 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 There is no acute displaced fracture. IMPRESSION: 1. Findings consistent with perforated acute appendicitis. There is an 8 cm abscess at the inferior margin of the right hepatic lobe. There is free air and free fluid in the abdomen and pelvis. 2. Small right-sided pleural effusion with adjacent atelectasis. 3. Low-grade small-bowel ileus. 4. Wall thickening of the cecum and ascending colon, likely reactive. 5. 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 These results were called by telephone at the time of interpretation on 05/16/2020 at 9:54 pm to provider [facility] , who verbally acknowledged these results. Electronically Signed   By: Katherine Mantle M.D.   On: 05/16/2020 21:58   DG Chest Port 1 View  Result Date: 05/16/2020 CLINICAL DATA:  Abdominal pain and distension.  Tachycardia. EXAM: PORTABLE CHEST 1 VIEW COMPARISON:  None. FINDINGS: Shallow lung inflation with bibasilar atelectasis. No focal airspace consolidation. No pulmonary edema or pleural effusion. Normal cardiomediastinal contours. No pneumothorax. IMPRESSION: Shallow lung inflation with bibasilar atelectasis. Electronically Signed   By: Caryn Bee  Collins Scotland M.D.   On: 05/16/2020 20:41    Labs:  CBC: Recent Labs    05/16/20 1936 05/17/20 0421  WBC 17.2* 12.0*  HGB 15.6 12.7*  HCT 45.9 38.1*  PLT 317 239    COAGS: Recent Labs    05/17/20 0421  INR 1.4*    BMP: Recent Labs    05/16/20 1936 05/17/20 0421  NA 131* 136  K 3.8 4.1  CL 92* 98  CO2 23 26  GLUCOSE 163* 113*  BUN 34* 22*  CALCIUM 9.0 8.2*  CREATININE 1.62* 1.23  GFRNONAA 57* >60  GFRAA >60 >60    LIVER FUNCTION TESTS: Recent Labs    05/16/20 1936  BILITOT 2.4*  AST 27  ALT 36  ALKPHOS 76  PROT 7.7  ALBUMIN 3.7    TUMOR MARKERS: No results for input(s): AFPTM, CEA, CA199, CHROMGRNA in the last 8760  hours.  Assessment and Plan:  Perforated appendicitis with intra-abdominal abscess: Jewel Baize de Clovia Cuff, 28 year old male, presents today to the [facility] Interventional Radiology department for an  image-guided intra-abdominal fluid aspiration with possible drain(s) placement.   This patient is Spanish-speaking with limited [place] proficiency. A medical Spanish video interpreter was utilized to explain the procedure, obtain consent, and perform an assessment. The patient had a friend/family member at the bedside who also participated in the conversation.   Risks and benefits discussed with the patient including bleeding, infection, damage to adjacent structures, bowel perforation/fistula connection, and sepsis.  The patient has been NPO and he is currently receiving 3.375 grams of IV Zosyn every 8 hours. He is not on any blood-thinning medications. Labs and vitals have been reviewed and are within acceptable limits.  All of the patient's questions were answered, patient is agreeable to proceed.  Consent signed and in chart.  Thank you for this interesting consult.  I greatly enjoyed meeting [place] and look forward to participating in their care.  A copy of this report was sent to the requesting provider on this date.  Electronically Signed: Theresa Duty, NP 05/17/2020, 8:00 AM   I spent a total of 40 minutes  in face to face in clinical consultation, greater than 50% of which was counseling/coordinating care for image-guided intra-abdominal fluid aspiration with possible drain(s) placement.

## 2020-05-17 NOTE — Plan of Care (Signed)
  Problem: Fluid Volume: Goal: Hemodynamic stability will improve Outcome: Not Progressing   Problem: Clinical Measurements: Goal: Signs and symptoms of infection will decrease Outcome: Not Progressing   Problem: Education: Goal: Knowledge of General Education information will improve Description: Including pain rating scale, medication(s)/side effects and non-pharmacologic comfort measures Outcome: Not Progressing   Problem: Health Behavior/Discharge Planning: Goal: Ability to manage health-related needs will improve Outcome: Not Progressing   Problem: Clinical Measurements: Goal: Ability to maintain clinical measurements within normal limits will improve Outcome: Not Progressing Goal: Will remain free from infection Outcome: Not Progressing Goal: Diagnostic test results will improve Outcome: Not Progressing Goal: Respiratory complications will improve Outcome: Not Progressing Goal: Cardiovascular complication will be avoided Outcome: Not Progressing   Problem: Activity: Goal: Risk for activity intolerance will decrease Outcome: Not Progressing   Problem: Nutrition: Goal: Adequate nutrition will be maintained Outcome: Not Progressing   Problem: Coping: Goal: Level of anxiety will decrease Outcome: Not Progressing   Problem: Elimination: Goal: Will not experience complications related to bowel motility Outcome: Not Progressing Goal: Will not experience complications related to urinary retention Outcome: Not Progressing   Problem: Pain Managment: Goal: General experience of comfort will improve Outcome: Not Progressing   Problem: Safety: Goal: Ability to remain free from injury will improve Outcome: Not Progressing   Problem: Skin Integrity: Goal: Risk for impaired skin integrity will decrease Outcome: Not Progressing

## 2020-05-17 NOTE — Progress Notes (Signed)
Initial Nutrition Assessment  DOCUMENTATION CODES:   Not applicable  INTERVENTION:  Provide Boost Breeze po TID, each supplement provides 250 kcal and 9 grams of protein.  NUTRITION DIAGNOSIS:   Increased nutrient needs related to acute illness (perforated acute appendicitis.) as evidenced by estimated needs.  GOAL:   Patient will meet greater than or equal to 90% of their needs  MONITOR:   PO intake, Supplement acceptance, Skin, Weight trends, Labs, I & O's, Diet advancement  REASON FOR ASSESSMENT:   Malnutrition Screening Tool    ASSESSMENT:   29 y.o. male with no past medical history who presented with abdominal pain, vomiting and constipation. Pt found to have tachycardia, leukocytosis, and acute kidney injury. CT abdomen/pelvis 05/16/20: Findings consistent with perforated acute appendicitis.  Pt underwent image guided drain placement today in IR. Pt unavailable undergoing procedure during time of visit. Unable to complete Nutrition-Focused physical exam at this time. Per MD note, pt with no appetite and reports not being able to eat over the past 3 days prior to admission. Diet has been advanced to a clear liquid diet. RD to order Boost Breeze supplements to aid in caloric and protein needs.   Labs and medications reviewed.   Diet Order:   Diet Order            Diet clear liquid Room service appropriate? Yes; Fluid consistency: Thin  Diet effective now                 EDUCATION NEEDS:   Not appropriate for education at this time  Skin:  Skin Assessment: Reviewed RN Assessment  Last BM:  Unknown  Height:   Ht Readings from Last 1 Encounters:  No data found for Ht    Weight:   Wt Readings from Last 1 Encounters:  05/17/20 89.8 kg    BMI:  There is no height or weight on file to calculate BMI.  Estimated Nutritional Needs:   Kcal:  2200-2400  Protein:  105-120 grams  Fluid:  >/= 2 L/day  Roslyn Smiling, MS, RD, LDN RD pager number/after  hours weekend pager number on Amion.

## 2020-05-17 NOTE — Progress Notes (Signed)
[facility] Progress Note     Subjective: CC-  States that he feels fine this morning. He reports abdominal bloating but denies significant pain. Denies n/v. No flatus or BM. WBC down 12, afebrile. He is still tachycardic. Creatinine down 1.23 IR consult pending for possible perc drain.  Objective: Vital signs in last 24 hours: Temp:  [98.2 F (36.8 C)-99.3 F (37.4 C)] 98.2 F (36.8 C) (06/11 0308) Pulse Rate:  [124-159] 124 (06/11 0308) Resp:  [18-43] 20 (06/10 2315) BP: (112-130)/(73-85) 122/74 (06/11 0308) SpO2:  [91 %-95 %] 93 % (06/11 0308) Weight:  [89.8 kg] 89.8 kg (06/11 0308)    Intake/Output from previous day: 06/10 0701 - 06/11 0700 In: -  Out: 600 [Urine:600] Intake/Output this shift: No intake/output data recorded.  PE: Gen:  Alert, NAD, pleasant Card:  tachycardic Pulm:  Mild tachypnea, O2 sats mid-90's on room air Abd: Soft, distended, +BS, minimal L and R lower abdominal TTP without peritonitis Skin: no rashes noted, warm and dry  Lab Results:  Recent Labs    05/16/20 1936 05/17/20 0421  WBC 17.2* 12.0*  HGB 15.6 12.7*  HCT 45.9 38.1*  PLT 317 239   BMET Recent Labs    05/16/20 1936 05/17/20 0421  NA 131* 136  K 3.8 4.1  CL 92* 98  CO2 23 26  GLUCOSE 163* 113*  BUN 34* 22*  CREATININE 1.62* 1.23  CALCIUM 9.0 8.2*   PT/INR Recent Labs    05/17/20 0421  LABPROT 16.5*  INR 1.4*   CMP     Component Value Date/Time   NA 136 05/17/2020 0421   K 4.1 05/17/2020 0421   CL 98 05/17/2020 0421   CO2 26 05/17/2020 0421   GLUCOSE 113 (H) 05/17/2020 0421   BUN 22 (H) 05/17/2020 0421   CREATININE 1.23 05/17/2020 0421   CALCIUM 8.2 (L) 05/17/2020 0421   PROT 7.7 05/16/2020 1936   ALBUMIN 3.7 05/16/2020 1936   AST 27 05/16/2020 1936   ALT 36 05/16/2020 1936   ALKPHOS 76 05/16/2020 1936   BILITOT 2.4 (H) 05/16/2020 1936   GFRNONAA >60 05/17/2020 0421   GFRAA >60 05/17/2020 0421   Lipase     Component Value Date/Time    LIPASE 18 05/16/2020 1936       Studies/Results: CT ABDOMEN PELVIS W CONTRAST  Result Date: 05/16/2020 CLINICAL DATA:  Abdominal pain. EXAM: CT ABDOMEN AND PELVIS WITH CONTRAST TECHNIQUE: Multidetector CT imaging of the abdomen and pelvis was performed using the standard protocol following bolus administration of intravenous contrast. CONTRAST:  172mL OMNIPAQUE IOHEXOL 300 MG/ML  SOLN COMPARISON:  None. FINDINGS: Lower chest: There is a small right-sided pleural effusion.There is atelectasis at the lung bases. Hepatobiliary: The liver is normal. Normal gallbladder.There is no biliary ductal dilation. Pancreas: Normal contours without ductal dilatation. No peripancreatic fluid collection. Spleen: Unremarkable. Adrenals/Urinary Tract: --Adrenal glands: Unremarkable. --Right kidney/ureter: No hydronephrosis or radiopaque kidney stones. --Left kidney/ureter: No hydronephrosis or radiopaque kidney stones. --Urinary bladder: Unremarkable. Stomach/Bowel: --Stomach/Duodenum: There is some mild diffuse wall thickening of the distal esophagus. The visualized portions of the stomach are unremarkable. --Small bowel: There are mildly dilated loops of small bowel scattered throughout the abdomen. --Colon: There is mild wall thickening of the ascending colon, likely reactive in etiology. --Appendix: There are findings of perforated acute appendicitis. There is a forming abscess along the inferior margin of the right hepatic lobe measuring approximately 8.2 by 2.8 cm (coronal series 6, image 64). There are pockets  of free air in the upper abdomen. There is a small amount of free fluid in the abdomen and pelvis. Vascular/Lymphatic: Normal course and caliber of the major abdominal vessels. --No retroperitoneal lymphadenopathy. --No mesenteric lymphadenopathy. --No pelvic or inguinal lymphadenopathy. Reproductive: Unremarkable Other: Free fluid and free air is noted in the abdomen and pelvis. The abdominal wall is normal.  Musculoskeletal. 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 There is no acute displaced fracture. IMPRESSION: 1. Findings consistent with perforated acute appendicitis. There is an 8 cm abscess at the inferior margin of the right hepatic lobe. There is free air and free fluid in the abdomen and pelvis. 2. Small right-sided pleural effusion with adjacent atelectasis. 3. Low-grade small-bowel ileus. 4. Wall thickening of the cecum and ascending colon, likely reactive. 5. 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 These results were called by telephone at the time of interpretation on 05/16/2020 at 9:54 pm to provider [facility] , who verbally acknowledged these results. Electronically Signed   By: Katherine Mantle M.D.   On: 05/16/2020 21:58   DG Chest Port 1 View  Result Date: 05/16/2020 CLINICAL DATA:  Abdominal pain and distension.  Tachycardia. EXAM: PORTABLE CHEST 1 VIEW COMPARISON:  None. FINDINGS: Shallow lung inflation with bibasilar atelectasis. No focal airspace consolidation. No pulmonary edema or pleural effusion. Normal cardiomediastinal contours. No pneumothorax. IMPRESSION: Shallow lung inflation with bibasilar atelectasis. Electronically Signed   By: Deatra Robinson M.D.   On: 05/16/2020 20:41    Anti-infectives: Anti-infectives (From admission, onward)   Start     Dose/Rate Route Frequency Ordered Stop   05/17/20 0500  piperacillin-tazobactam (ZOSYN) IVPB 3.375 g     Discontinue     3.375 g 12.5 mL/hr over 240 Minutes Intravenous Every 8 hours 05/16/20 2202     05/16/20 2215  piperacillin-tazobactam (ZOSYN) IVPB 3.375 g        3.375 g 100 mL/hr over 30 Minutes Intravenous  Once 05/16/20 2200 05/16/20 2240       Assessment/Plan  SIRS AKI - improved, Cr 1.23, continue IVF Perforated appendicitis with intra-abdominal abscess  - CT scan shows perforated acute appendicitis with an 8 cm abscess at the inferior  margin of the right hepatic lobe  ID - zosyn 6/10>> FEN - IVF, NPO VTE - SCDs, hold lovenox for possible procedure Foley - none Follow up - TBD  Plan - IR consult pending for possible drain placement. Keep NPO for now, ok for clear liquids after procedure. Continue IV zosyn and IVF.    LOS: 1 day    Franne Forts, [facility] 05/17/2020, 7:38 AM Please see Amion for pager number during day hours 7:00am-4:30pm

## 2020-05-18 LAB — BASIC METABOLIC PANEL
Anion gap: 13 (ref 5–15)
BUN: 14 mg/dL (ref 6–20)
CO2: 22 mmol/L (ref 22–32)
Calcium: 8.2 mg/dL — ABNORMAL LOW (ref 8.9–10.3)
Chloride: 102 mmol/L (ref 98–111)
Creatinine, Ser: 0.99 mg/dL (ref 0.61–1.24)
GFR calc Af Amer: >60 mL/min (ref >60)
GFR calc non Af Amer: >60 mL/min (ref >60)
Glucose, Bld: 120 mg/dL — ABNORMAL HIGH (ref 70–99)
Potassium: 3.5 mmol/L (ref 3.5–5.1)
Sodium: 137 mmol/L (ref 135–145)

## 2020-05-18 LAB — CBC
HCT: 40 % (ref 39.0–52.0)
Hemoglobin: 13.3 g/dL (ref 13.0–17.0)
MCH: 29.4 pg (ref 26.0–34.0)
MCHC: 33.3 g/dL (ref 30.0–36.0)
MCV: 88.5 fL (ref 80.0–100.0)
Platelets: 297 10*3/uL (ref 150–400)
RBC: 4.52 MIL/uL (ref 4.22–5.81)
RDW: 13.2 % (ref 11.5–15.5)
WBC: 15.2 10*3/uL — ABNORMAL HIGH (ref 4.0–10.5)
nRBC: 0 % (ref 0.0–0.2)

## 2020-05-18 LAB — MAGNESIUM: Magnesium: 2.6 mg/dL — ABNORMAL HIGH (ref 1.7–2.4)

## 2020-05-18 MED ORDER — SODIUM CHLORIDE 0.9% FLUSH
5.0000 mL | Freq: Three times a day (TID) | INTRAVENOUS | Status: DC
  Administered 2020-05-18 – 2020-05-20 (×7): 5 mL

## 2020-05-18 NOTE — Progress Notes (Signed)
Subjective/Chief Complaint: Pt doing well Having diarrrhea Pain improved   Objective: Vital signs in last 24 hours: Temp:  [98.3 F (36.8 C)-101.2 F (38.4 C)] 98.5 F (36.9 C) (06/12 0346) Pulse Rate:  [98-132] 98 (06/12 0346) Resp:  [20-38] 27 (06/11 2341) BP: (104-142)/(65-112) 128/81 (06/12 0346) SpO2:  [89 %-97 %] 97 % (06/12 0346) Last BM Date: 05/18/20  Intake/Output from previous day: 06/11 0701 - 06/12 0700 In: 1660 [I.V.:1500; IV Piggyback:150] Out: 210 [Drains:210] Intake/Output this shift: No intake/output data recorded.  PE; Constitutional: No acute distress, conversant, appears states age. Eyes: Anicteric sclerae, moist conjunctiva, no lid lag Lungs: Clear to auscultation bilaterally, normal respiratory effort CV: regular rate and rhythm, no murmurs, no peripheral edema, pedal pulses 2+ GI: Soft, no masses or hepatosplenomegaly, non-tender to palpation, drain in place with pus Skin: No rashes, palpation reveals normal turgor Psychiatric: appropriate judgment and insight, oriented to person, place, and time   Lab Results:  Recent Labs    05/17/20 0421 05/18/20 0305  WBC 12.0* 15.2*  HGB 12.7* 13.3  HCT 38.1* 40.0  PLT 239 297   BMET Recent Labs    05/17/20 0421 05/18/20 0305  NA 136 137  K 4.1 3.5  CL 98 102  CO2 26 22  GLUCOSE 113* 120*  BUN 22* 14  CREATININE 1.23 0.99  CALCIUM 8.2* 8.2*   PT/INR Recent Labs    05/17/20 0421  LABPROT 16.5*  INR 1.4*   ABG No results for input(s): PHART, HCO3 in the last 72 hours.  Invalid input(s): PCO2, PO2  Studies/Results: CT ABDOMEN PELVIS W CONTRAST  Result Date: 05/16/2020 CLINICAL DATA:  Abdominal pain. EXAM: CT ABDOMEN AND PELVIS WITH CONTRAST TECHNIQUE: Multidetector CT imaging of the abdomen and pelvis was performed using the standard protocol following bolus administration of intravenous contrast. CONTRAST:  OMNIPAQUE IOHEXOL 300 MG/ML  SOLN COMPARISON:  None. FINDINGS:  Lower chest: There is a small right-sided pleural effusion.There is atelectasis at the lung bases. Hepatobiliary: The liver is normal. Normal gallbladder.There is no biliary ductal dilation. Pancreas: Normal contours without ductal dilatation. No peripancreatic fluid collection. Spleen: Unremarkable. Adrenals/Urinary Tract: --Adrenal glands: Unremarkable. --Right kidney/ureter: No hydronephrosis or radiopaque kidney stones. --Left kidney/ureter: No hydronephrosis or radiopaque kidney stones. --Urinary bladder: Unremarkable. Stomach/Bowel: --Stomach/Duodenum: There is some mild diffuse wall thickening of the distal esophagus. The visualized portions of the stomach are unremarkable. --Small bowel: There are mildly dilated loops of small bowel scattered throughout the abdomen. --Colon: There is mild wall thickening of the ascending colon, likely reactive in etiology. --Appendix: There are findings of perforated acute appendicitis. There is a forming abscess along the inferior margin of the right hepatic lobe measuring approximately 8.2 by 2.8 cm (coronal series 6, image 64). There are pockets of free air in the upper abdomen. There is a small amount of free fluid in the abdomen and pelvis. Vascular/Lymphatic: Normal course and caliber of the major abdominal vessels. --No retroperitoneal lymphadenopathy. --No mesenteric lymphadenopathy. --No pelvic or inguinal lymphadenopathy. Reproductive: Unremarkable Other: Free fluid and free air is noted in the abdomen and pelvis. The abdominal wall is normal. Musculoskeletal. 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 There is no acute displaced fracture. IMPRESSION: 1. Findings consistent with perforated acute appendicitis. There is an 8 cm abscess at the inferior margin of the right hepatic lobe. There is free air and free fluid in the abdomen and pelvis. 2. Small right-sided pleural  effusion with adjacent atelectasis. 3. Low-grade  small-bowel ileus. 4. Wall thickening of the cecum and ascending colon, likely reactive. 5. 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 These results were called by telephone at the time of interpretation on 05/16/2020 at 9:54 pm to provider [facility] , who verbally acknowledged these results. Electronically Signed   By: Constance Holster M.D.   On: 05/16/2020 21:58   DG Chest Port 1 View  Result Date: 05/16/2020 CLINICAL DATA:  Abdominal pain and distension.  Tachycardia. EXAM: PORTABLE CHEST 1 VIEW COMPARISON:  None. FINDINGS: Shallow lung inflation with bibasilar atelectasis. No focal airspace consolidation. No pulmonary edema or pleural effusion. Normal cardiomediastinal contours. No pneumothorax. IMPRESSION: Shallow lung inflation with bibasilar atelectasis. Electronically Signed   By: Ulyses Jarred M.D.   On: 05/16/2020 20:41   CT IMAGE GUIDED DRAINAGE BY PERCUTANEOUS CATHETER  Result Date: 05/17/2020 INDICATION: 29 year old male with perforated appendicitis. He has a fluid and air collection in the right subhepatic space from the appendiceal tip as well as a dependent complex fluid collection in the pelvic cul-de-sac. He presents for placement of 2 separate CT-guided drainage catheters. EXAM: CT-guided drain - right subhepatic space CT-guided drain - pelvic cul-de-sac MEDICATIONS: The patient is currently admitted to the hospital and receiving intravenous antibiotics. The antibiotics were administered within an appropriate time frame prior to the initiation of the procedure. ANESTHESIA/SEDATION: Fentanyl 150 mcg IV; Versed 3 mg IV Moderate Sedation Time:  45 minutes The patient was continuously monitored during the procedure by the interventional radiology nurse under my direct supervision. COMPLICATIONS: None immediate. PROCEDURE: Informed written consent was obtained from the patient after a thorough discussion of the procedural risks, benefits and alternatives. All  questions were addressed. Maximal Sterile Barrier Technique was utilized including caps, mask, sterile gowns, sterile gloves, sterile drape, hand hygiene and skin antiseptic. A timeout was performed prior to the initiation of the procedure. A planning axial CT scan was performed. The fluid and gas collection in the right subhepatic space was successfully identified. A suitable skin entry site was selected and marked. The overlying skin was sterilely prepped and draped in the standard fashion using chlorhexidine skin prep. Local anesthesia was attained by infiltration with 1% lidocaine. A small dermatotomy was made. Under intermittent CT guidance, an 18 gauge trocar needle was advanced into the fluid and gas collection. A 0.035 wire was then coiled in the collection. The skin tract was dilated to 12 [place]. A 12 French drainage catheter was advanced over the wire and formed. There was return of approximately 80 mL of foul-smelling purulent fluid. A sample was sent for Gram stain and culture. The catheter was flushed, secured to the skin with 0 Prolene suture and connected to JP bulb suction. The patient was then repositioned into the left lateral decubitus position. Repeat CT imaging was performed of the pelvis. The pelvic cul-de-sac fluid collection was identified. A suitable skin entry site was selected and marked. The overlying skin was sterilely prepped and draped in the standard fashion using chlorhexidine skin prep. Local anesthesia was attained by infiltration with 1% lidocaine. A small dermatotomy was made. Under intermittent CT guidance, an 18 gauge trocar needle was advanced along a Peri sacral trajectory through the most medial aspect of the sacral spinous ligament and into the perirectal fluid collection. A 0.035 wire was advanced. The tract was dilated to 10 [place]. A Cook 12 [place] all-purpose drainage catheter was advanced over the wire and formed. Aspiration yields approximately  40 mL of thick, foul  smelling purulent fluid. The catheter was gently flushed, secured to the skin with 0 Prolene suture and connected to JP bulb suction. Bandages were applied. IMPRESSION: 1. Successful placement of 12 French drainage catheter into the right subhepatic fluid and gas collection with aspiration of 80 mL purulent foul-smelling fluid. 2. Successful placement of a 12 French drainage catheter into the pelvic cul-de-sac fluid collection with aspiration of 40 mL of purulent foul-smelling fluid. Signed, Sterling Big, MD, RPVI [facility] Radiology Electronically Signed   By: Malachy Moan M.D.   On: 05/17/2020 16:16   CT IMAGE GUIDED DRAINAGE BY PERCUTANEOUS CATHETER  Result Date: 05/17/2020 INDICATION: 29 year old male with perforated appendicitis. He has a fluid and air collection in the right subhepatic space from the appendiceal tip as well as a dependent complex fluid collection in the pelvic cul-de-sac. He presents for placement of 2 separate CT-guided drainage catheters. EXAM: CT-guided drain - right subhepatic space CT-guided drain - pelvic cul-de-sac MEDICATIONS: The patient is currently admitted to the hospital and receiving intravenous antibiotics. The antibiotics were administered within an appropriate time frame prior to the initiation of the procedure. ANESTHESIA/SEDATION: Fentanyl 150 mcg IV; Versed 3 mg IV Moderate Sedation Time:  45 minutes The patient was continuously monitored during the procedure by the interventional radiology nurse under my direct supervision. COMPLICATIONS: None immediate. PROCEDURE: Informed written consent was obtained from the patient after a thorough discussion of the procedural risks, benefits and alternatives. All questions were addressed. Maximal Sterile Barrier Technique was utilized including caps, mask, sterile gowns, sterile gloves, sterile drape, hand hygiene and skin antiseptic. A timeout was performed prior to  the initiation of the procedure. A planning axial CT scan was performed. The fluid and gas collection in the right subhepatic space was successfully identified. A suitable skin entry site was selected and marked. The overlying skin was sterilely prepped and draped in the standard fashion using chlorhexidine skin prep. Local anesthesia was attained by infiltration with 1% lidocaine. A small dermatotomy was made. Under intermittent CT guidance, an 18 gauge trocar needle was advanced into the fluid and gas collection. A 0.035 wire was then coiled in the collection. The skin tract was dilated to 12 [place]. A 12 French drainage catheter was advanced over the wire and formed. There was return of approximately 80 mL of foul-smelling purulent fluid. A sample was sent for Gram stain and culture. The catheter was flushed, secured to the skin with 0 Prolene suture and connected to JP bulb suction. The patient was then repositioned into the left lateral decubitus position. Repeat CT imaging was performed of the pelvis. The pelvic cul-de-sac fluid collection was identified. A suitable skin entry site was selected and marked. The overlying skin was sterilely prepped and draped in the standard fashion using chlorhexidine skin prep. Local anesthesia was attained by infiltration with 1% lidocaine. A small dermatotomy was made. Under intermittent CT guidance, an 18 gauge trocar needle was advanced along a Peri sacral trajectory through the most medial aspect of the sacral spinous ligament and into the perirectal fluid collection. A 0.035 wire was advanced. The tract was dilated to 49 [place]. A Cook 12 [place] all-purpose drainage catheter was advanced over the wire and formed. Aspiration yields approximately 40 mL of thick, foul smelling purulent fluid. The catheter was gently flushed, secured to the skin with 0 Prolene suture and connected to JP bulb suction. Bandages were applied. IMPRESSION: 1. Successful placement of  12 French  drainage catheter into the right subhepatic fluid and gas collection with aspiration of 80 mL purulent foul-smelling fluid. 2. Successful placement of a 12 French drainage catheter into the pelvic cul-de-sac fluid collection with aspiration of 40 mL of purulent foul-smelling fluid. Signed, Sterling Big, MD, RPVI [facility] Radiology Electronically Signed   By: Malachy Moan M.D.   On: 05/17/2020 16:16    Anti-infectives: Anti-infectives (From admission, onward)   Start     Dose/Rate Route Frequency Ordered Stop   05/17/20 0500  piperacillin-tazobactam (ZOSYN) IVPB 3.375 g     Discontinue     3.375 g 12.5 mL/hr over 240 Minutes Intravenous Every 8 hours 05/16/20 2202     05/16/20 2215  piperacillin-tazobactam (ZOSYN) IVPB 3.375 g        3.375 g 100 mL/hr over 30 Minutes Intravenous  Once 05/16/20 2200 05/16/20 2240      Assessment/Plan:  SIRS AKI - improved, Cr 0.99, continue IVF Perforated appendicitis with intra-abdominal abscess  -cont' drain -con't abx  ID - zosyn 6/10>> FEN - IVF, adv diet VTE - SCDs, hold lovenox for possible procedure Foley - none Follow up - TBD  Plan - ADAT , con't abx, hopefully home when tachycardia resolved   LOS: 2 days    Axel Filler 05/18/2020

## 2020-05-18 NOTE — Progress Notes (Signed)
Referring Physician(s): Dr. Leonard Schwartz. Janee Morn  Supervising Physician: Ruel Favors  Patient Status:  [facility] - In-pt  Chief Complaint:  Right sided abdominal pain found to be perforated appendix  Subjective:  29 y.o, Spanish speaking male inpatient. Presented to the emergency department with 2 -3 days of right sided  abdominal pain, nausea, vomiting and constipation. Patient was found to have leokocytosis, tachycardic and  AKI. Ct abd pelvis showed perforated appendix with free fluid in abdomen and pelvis. IR placed two drains a subhepatic fluid collection and a left transgluteal on 6.11.21. Patient alert and laying in bed, calm and comfortable. Denies any fevers, headache, chest pain, SOB, cough bleeding. Patient reporting improvement in abdominal pain, nausea and vomiting since drain placement  Allergies: Patient has no known allergies.  Medications: Prior to Admission medications   Not on File     Vital Signs: BP 124/80 (BP Location: Right Arm)   Pulse 93   Temp 98.5 F (36.9 C) (Oral)   Resp 19   Wt 197 lb 15.6 oz (89.8 kg)   SpO2 97%   Physical Exam Vitals and nursing note reviewed.  Constitutional:      Appearance: He is well-developed.  HENT:     Head: Normocephalic.  Cardiovascular:     Rate and Rhythm: Regular rhythm. Tachycardia present.  Pulmonary:     Effort: Pulmonary effort is normal.  Abdominal:     Tenderness: There is generalized abdominal tenderness ( improved from 6.11.21).     Comments: Positive subhepatic drain  to  suction site is unremarkable with no erythema, edema, tenderness, bleeding or drainage noted at exit site. Suture and stat lock in place. Dressing is clean dry and intact. 3 ml of  Thick pale  colored fluid noted in bulb suction device. Drain is able to be flushed easily.  Positive left transgluteal drain  to suction. Site is unremarkable with no erythema, edema, tenderness, bleeding or drainage noted at exit site. Suture and stat lock in  place. Dressing is clean dry and intact. Drain is able to be flushed easily.      Musculoskeletal:        General: Normal range of motion.     Cervical back: Normal range of motion.  Skin:    General: Skin is dry.  Neurological:     Mental Status: He is alert and oriented to person, place, and time.     Imaging: CT ABDOMEN PELVIS W CONTRAST  Result Date: 05/16/2020 CLINICAL DATA:  Abdominal pain. EXAM: CT ABDOMEN AND PELVIS WITH CONTRAST TECHNIQUE: Multidetector CT imaging of the abdomen and pelvis was performed using the standard protocol following bolus administration of intravenous contrast. CONTRAST:  OMNIPAQUE IOHEXOL 300 MG/ML  SOLN COMPARISON:  None. FINDINGS: Lower chest: There is a small right-sided pleural effusion.There is atelectasis at the lung bases. Hepatobiliary: The liver is normal. Normal gallbladder.There is no biliary ductal dilation. Pancreas: Normal contours without ductal dilatation. No peripancreatic fluid collection. Spleen: Unremarkable. Adrenals/Urinary Tract: --Adrenal glands: Unremarkable. --Right kidney/ureter: No hydronephrosis or radiopaque kidney stones. --Left kidney/ureter: No hydronephrosis or radiopaque kidney stones. --Urinary bladder: Unremarkable. Stomach/Bowel: --Stomach/Duodenum: There is some mild diffuse wall thickening of the distal esophagus. The visualized portions of the stomach are unremarkable. --Small bowel: There are mildly dilated loops of small bowel scattered throughout the abdomen. --Colon: There is mild wall thickening of the ascending colon, likely reactive in etiology. --Appendix: There are findings of perforated acute appendicitis. There is a forming abscess along the inferior  margin of the right hepatic lobe measuring approximately 8.2 by 2.8 cm (coronal series 6, image 64). There are pockets of free air in the upper abdomen. There is a small amount of free fluid in the abdomen and pelvis. Vascular/Lymphatic: Normal course and  caliber of the major abdominal vessels. --No retroperitoneal lymphadenopathy. --No mesenteric lymphadenopathy. --No pelvic or inguinal lymphadenopathy. Reproductive: Unremarkable Other: Free fluid and free air is noted in the abdomen and pelvis. The abdominal wall is normal. Musculoskeletal. 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 There is no acute displaced fracture. IMPRESSION: 1. Findings consistent with perforated acute appendicitis. There is an 8 cm abscess at the inferior margin of the right hepatic lobe. There is free air and free fluid in the abdomen and pelvis. 2. Small right-sided pleural effusion with adjacent atelectasis. 3. Low-grade small-bowel ileus. 4. Wall thickening of the cecum and ascending colon, likely reactive. 5. 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 These results were called by telephone at the time of interpretation on 05/16/2020 at 9:54 pm to provider [facility] , who verbally acknowledged these results. Electronically Signed   By: Constance Holster M.D.   On: 05/16/2020 21:58   DG Chest Port 1 View  Result Date: 05/16/2020 CLINICAL DATA:  Abdominal pain and distension.  Tachycardia. EXAM: PORTABLE CHEST 1 VIEW COMPARISON:  None. FINDINGS: Shallow lung inflation with bibasilar atelectasis. No focal airspace consolidation. No pulmonary edema or pleural effusion. Normal cardiomediastinal contours. No pneumothorax. IMPRESSION: Shallow lung inflation with bibasilar atelectasis. Electronically Signed   By: Ulyses Jarred M.D.   On: 05/16/2020 20:41   CT IMAGE GUIDED DRAINAGE BY PERCUTANEOUS CATHETER  Result Date: 05/17/2020 INDICATION: 28 year old male with perforated appendicitis. He has a fluid and air collection in the right subhepatic space from the appendiceal tip as well as a dependent complex fluid collection in the pelvic cul-de-sac. He presents for placement of 2 separate CT-guided drainage  catheters. EXAM: CT-guided drain - right subhepatic space CT-guided drain - pelvic cul-de-sac MEDICATIONS: The patient is currently admitted to the hospital and receiving intravenous antibiotics. The antibiotics were administered within an appropriate time frame prior to the initiation of the procedure. ANESTHESIA/SEDATION: Fentanyl 150 mcg IV; Versed 3 mg IV Moderate Sedation Time:  45 minutes The patient was continuously monitored during the procedure by the interventional radiology nurse under my direct supervision. COMPLICATIONS: None immediate. PROCEDURE: Informed written consent was obtained from the patient after a thorough discussion of the procedural risks, benefits and alternatives. All questions were addressed. Maximal Sterile Barrier Technique was utilized including caps, mask, sterile gowns, sterile gloves, sterile drape, hand hygiene and skin antiseptic. A timeout was performed prior to the initiation of the procedure. A planning axial CT scan was performed. The fluid and gas collection in the right subhepatic space was successfully identified. A suitable skin entry site was selected and marked. The overlying skin was sterilely prepped and draped in the standard fashion using chlorhexidine skin prep. Local anesthesia was attained by infiltration with 1% lidocaine. A small dermatotomy was made. Under intermittent CT guidance, an 18 gauge trocar needle was advanced into the fluid and gas collection. A 0.035 wire was then coiled in the collection. The skin tract was dilated to 12 [place]. A 12 French drainage catheter was advanced over the wire and formed. There was return of approximately 80 mL of foul-smelling purulent fluid. A sample was sent for Gram stain and culture.  The catheter was flushed, secured to the skin with 0 Prolene suture and connected to JP bulb suction. The patient was then repositioned into the left lateral decubitus position. Repeat CT imaging was performed of the pelvis. The pelvic  cul-de-sac fluid collection was identified. A suitable skin entry site was selected and marked. The overlying skin was sterilely prepped and draped in the standard fashion using chlorhexidine skin prep. Local anesthesia was attained by infiltration with 1% lidocaine. A small dermatotomy was made. Under intermittent CT guidance, an 18 gauge trocar needle was advanced along a Peri sacral trajectory through the most medial aspect of the sacral spinous ligament and into the perirectal fluid collection. A 0.035 wire was advanced. The tract was dilated to 63 [place]. A Cook 12 [place] all-purpose drainage catheter was advanced over the wire and formed. Aspiration yields approximately 40 mL of thick, foul smelling purulent fluid. The catheter was gently flushed, secured to the skin with 0 Prolene suture and connected to JP bulb suction. Bandages were applied. IMPRESSION: 1. Successful placement of 12 French drainage catheter into the right subhepatic fluid and gas collection with aspiration of 80 mL purulent foul-smelling fluid. 2. Successful placement of a 12 French drainage catheter into the pelvic cul-de-sac fluid collection with aspiration of 40 mL of purulent foul-smelling fluid. Signed, Sterling Big, MD, RPVI [facility] Electronically Signed   By: Malachy Moan M.D.   On: 05/17/2020 16:16   CT IMAGE GUIDED DRAINAGE BY PERCUTANEOUS CATHETER  Result Date: 05/17/2020 INDICATION: 29 year old male with perforated appendicitis. He has a fluid and air collection in the right subhepatic space from the appendiceal tip as well as a dependent complex fluid collection in the pelvic cul-de-sac. He presents for placement of 2 separate CT-guided drainage catheters. EXAM: CT-guided drain - right subhepatic space CT-guided drain - pelvic cul-de-sac MEDICATIONS: The patient is currently admitted to the hospital and receiving intravenous antibiotics. The  antibiotics were administered within an appropriate time frame prior to the initiation of the procedure. ANESTHESIA/SEDATION: Fentanyl 150 mcg IV; Versed 3 mg IV Moderate Sedation Time:  45 minutes The patient was continuously monitored during the procedure by the interventional radiology nurse under my direct supervision. COMPLICATIONS: None immediate. PROCEDURE: Informed written consent was obtained from the patient after a thorough discussion of the procedural risks, benefits and alternatives. All questions were addressed. Maximal Sterile Barrier Technique was utilized including caps, mask, sterile gowns, sterile gloves, sterile drape, hand hygiene and skin antiseptic. A timeout was performed prior to the initiation of the procedure. A planning axial CT scan was performed. The fluid and gas collection in the right subhepatic space was successfully identified. A suitable skin entry site was selected and marked. The overlying skin was sterilely prepped and draped in the standard fashion using chlorhexidine skin prep. Local anesthesia was attained by infiltration with 1% lidocaine. A small dermatotomy was made. Under intermittent CT guidance, an 18 gauge trocar needle was advanced into the fluid and gas collection. A 0.035 wire was then coiled in the collection. The skin tract was dilated to 12 [place]. A 12 French drainage catheter was advanced over the wire and formed. There was return of approximately 80 mL of foul-smelling purulent fluid. A sample was sent for Gram stain and culture. The catheter was flushed, secured to the skin with 0 Prolene suture and connected to JP bulb suction. The patient was then repositioned into the left lateral decubitus position. Repeat CT imaging was performed of  the pelvis. The pelvic cul-de-sac fluid collection was identified. A suitable skin entry site was selected and marked. The overlying skin was sterilely prepped and draped in the standard fashion using chlorhexidine skin  prep. Local anesthesia was attained by infiltration with 1% lidocaine. A small dermatotomy was made. Under intermittent CT guidance, an 18 gauge trocar needle was advanced along a Peri sacral trajectory through the most medial aspect of the sacral spinous ligament and into the perirectal fluid collection. A 0.035 wire was advanced. The tract was dilated to 52 [place]. A Cook 12 [place] all-purpose drainage catheter was advanced over the wire and formed. Aspiration yields approximately 40 mL of thick, foul smelling purulent fluid. The catheter was gently flushed, secured to the skin with 0 Prolene suture and connected to JP bulb suction. Bandages were applied. IMPRESSION: 1. Successful placement of 12 French drainage catheter into the right subhepatic fluid and gas collection with aspiration of 80 mL purulent foul-smelling fluid. 2. Successful placement of a 12 French drainage catheter into the pelvic cul-de-sac fluid collection with aspiration of 40 mL of purulent foul-smelling fluid. Signed, Sterling Big, MD, RPVI [facility] Electronically Signed   By: Malachy Moan M.D.   On: 05/17/2020 16:16    Labs:  CBC: Recent Labs    05/16/20 1936 05/17/20 0421 05/18/20 0305  WBC 17.2* 12.0* 15.2*  HGB 15.6 12.7* 13.3  HCT 45.9 38.1* 40.0  PLT 317 239 297    COAGS: Recent Labs    05/17/20 0421  INR 1.4*    BMP: Recent Labs    05/16/20 1936 05/17/20 0421 05/18/20 0305  NA 131* 136 137  K 3.8 4.1 3.5  CL 92* 98 102  CO2 23 26 22   GLUCOSE 163* 113* 120*  BUN 34* 22* 14  CALCIUM 9.0 8.2* 8.2*  CREATININE 1.62* 1.23 0.99  GFRNONAA 57* >60 >60  GFRAA >60 >60 >60    LIVER FUNCTION TESTS: Recent Labs    05/16/20 1936  BILITOT 2.4*  AST 27  ALT 36  ALKPHOS 76  PROT 7.7  ALBUMIN 3.7    Assessment and Plan:  29 y.o, Spanish speaking male inpatient. Presented to the emergency department with 2 -3 days of right  sided  abdominal pain, nausea, vomiting and constipation. Patient was found to have leokocytosis, tachycardic and  AKI. Ct abd pelvis showed perforated appendix with free fluid in abdomen and pelvis. IR placed two drains a subhepatic fluid collection and a left transgluteal on 6.11.21. Per Epic output is:  RUQ - 130 ml  Left Transgluteal - 80 ml  Pertinent Imaging None since drain placement  Pertinent IR History 6.11.21 - Placement of subhepatic and left transgluteal drain  Pertinent Allergies NKDA  WBC is 15.2. Cultures from subhepatic pending All labs and medications are within acceptable parameters.  Patient is afebrile.  Recommend team continue with flushing TID, output recording q shift and dressing changes as needed. Would consider additional imaging when output is less than 10 ml for 24 hours not including flush material.      Electronically Signed: 8.11.21, NP 05/18/2020, 12:30 PM   I spent a total of 15 Minutes at the patient's bedside AND on the patient's hospital floor or unit, greater than 50% of which was counseling/coordinating care for Subhepatic and left transgluteal drain  Drain care discussed with patient and friend. All questions answered.

## 2020-05-19 LAB — BASIC METABOLIC PANEL
Anion gap: 9 (ref 5–15)
BUN: 10 mg/dL (ref 6–20)
CO2: 20 mmol/L — ABNORMAL LOW (ref 22–32)
Calcium: 8.1 mg/dL — ABNORMAL LOW (ref 8.9–10.3)
Chloride: 108 mmol/L (ref 98–111)
Creatinine, Ser: 0.74 mg/dL (ref 0.61–1.24)
GFR calc Af Amer: >60 mL/min (ref >60)
GFR calc non Af Amer: >60 mL/min (ref >60)
Glucose, Bld: 97 mg/dL (ref 70–99)
Potassium: 3.9 mmol/L (ref 3.5–5.1)
Sodium: 137 mmol/L (ref 135–145)

## 2020-05-19 LAB — CBC
HCT: 33 % — ABNORMAL LOW (ref 39.0–52.0)
Hemoglobin: 11 g/dL — ABNORMAL LOW (ref 13.0–17.0)
MCH: 29.2 pg (ref 26.0–34.0)
MCHC: 33.3 g/dL (ref 30.0–36.0)
MCV: 87.5 fL (ref 80.0–100.0)
Platelets: 253 10*3/uL (ref 150–400)
RBC: 3.77 MIL/uL — ABNORMAL LOW (ref 4.22–5.81)
RDW: 13.3 % (ref 11.5–15.5)
WBC: 14.1 10*3/uL — ABNORMAL HIGH (ref 4.0–10.5)
nRBC: 0 % (ref 0.0–0.2)

## 2020-05-19 MED ORDER — PANTOPRAZOLE SODIUM 40 MG PO TBEC
40.0000 mg | DELAYED_RELEASE_TABLET | Freq: Every day | ORAL | Status: DC
  Administered 2020-05-19: 40 mg via ORAL
  Filled 2020-05-19: qty 1

## 2020-05-19 NOTE — Progress Notes (Signed)
Subjective/Chief Complaint: Pt doing well Still with some diarrhea Tol PO Having BMs   Objective: Vital signs in last 24 hours: Temp:  [98 F (36.7 C)-100 F (37.8 C)] 99.2 F (37.3 C) (06/13 0315) Pulse Rate:  [91-105] 102 (06/13 0315) Resp:  [16-20] 20 (06/13 0315) BP: (124-138)/(75-87) 128/87 (06/13 0315) SpO2:  [93 %-97 %] 95 % (06/13 0315) Last BM Date: 05/18/20  Intake/Output from previous day: 06/12 0701 - 06/13 0700 In: 2475.8 [I.V.:2445.8] Out: 30 [Drains:30] Intake/Output this shift: No intake/output data recorded.  Constitutional: No acute distress, conversant, appears states age. Eyes: Anicteric sclerae, moist conjunctiva, no lid lag Lungs: Clear to auscultation bilaterally, normal respiratory effort CV: regular rate and rhythm, no murmurs, no peripheral edema, pedal pulses 2+ GI: Soft, no masses or hepatosplenomegaly, non-tender to palpation, Dr-purulent Skin: No rashes, palpation reveals normal turgor Psychiatric: appropriate judgment and insight, oriented to person, place, and time   Lab Results:  Recent Labs    05/18/20 0305 05/19/20 0442  WBC 15.2* 14.1*  HGB 13.3 11.0*  HCT 40.0 33.0*  PLT 297 253   BMET Recent Labs    05/18/20 0305 05/19/20 0442  NA 137 137  K 3.5 3.9  CL 102 108  CO2 22 20*  GLUCOSE 120* 97  BUN 14 10  CREATININE 0.99 0.74  CALCIUM 8.2* 8.1*   PT/INR Recent Labs    05/17/20 0421  LABPROT 16.5*  INR 1.4*   ABG No results for input(s): PHART, HCO3 in the last 72 hours.  Invalid input(s): PCO2, PO2  Studies/Results: CT IMAGE GUIDED DRAINAGE BY PERCUTANEOUS CATHETER  Result Date: 05/17/2020 INDICATION: 29 year old male with perforated appendicitis. He has a fluid and air collection in the right subhepatic space from the appendiceal tip as well as a dependent complex fluid collection in the pelvic cul-de-sac. He presents for placement of 2 separate CT-guided drainage catheters. EXAM: CT-guided drain -  right subhepatic space CT-guided drain - pelvic cul-de-sac MEDICATIONS: The patient is currently admitted to the hospital and receiving intravenous antibiotics. The antibiotics were administered within an appropriate time frame prior to the initiation of the procedure. ANESTHESIA/SEDATION: Fentanyl 150 mcg IV; Versed 3 mg IV Moderate Sedation Time:  45 minutes The patient was continuously monitored during the procedure by the interventional radiology nurse under my direct supervision. COMPLICATIONS: None immediate. PROCEDURE: Informed written consent was obtained from the patient after a thorough discussion of the procedural risks, benefits and alternatives. All questions were addressed. Maximal Sterile Barrier Technique was utilized including caps, mask, sterile gowns, sterile gloves, sterile drape, hand hygiene and skin antiseptic. A timeout was performed prior to the initiation of the procedure. A planning axial CT scan was performed. The fluid and gas collection in the right subhepatic space was successfully identified. A suitable skin entry site was selected and marked. The overlying skin was sterilely prepped and draped in the standard fashion using chlorhexidine skin prep. Local anesthesia was attained by infiltration with 1% lidocaine. A small dermatotomy was made. Under intermittent CT guidance, an 18 gauge trocar needle was advanced into the fluid and gas collection. A 0.035 wire was then coiled in the collection. The skin tract was dilated to 12 [place]. A 12 French drainage catheter was advanced over the wire and formed. There was return of approximately 80 mL of foul-smelling purulent fluid. A sample was sent for Gram stain and culture. The catheter was flushed, secured to the skin with 0 Prolene suture and connected to JP bulb suction. The  patient was then repositioned into the left lateral decubitus position. Repeat CT imaging was performed of the pelvis. The pelvic cul-de-sac fluid collection was  identified. A suitable skin entry site was selected and marked. The overlying skin was sterilely prepped and draped in the standard fashion using chlorhexidine skin prep. Local anesthesia was attained by infiltration with 1% lidocaine. A small dermatotomy was made. Under intermittent CT guidance, an 18 gauge trocar needle was advanced along a Peri sacral trajectory through the most medial aspect of the sacral spinous ligament and into the perirectal fluid collection. A 0.035 wire was advanced. The tract was dilated to 21 [place]. A Cook 12 [place] all-purpose drainage catheter was advanced over the wire and formed. Aspiration yields approximately 40 mL of thick, foul smelling purulent fluid. The catheter was gently flushed, secured to the skin with 0 Prolene suture and connected to JP bulb suction. Bandages were applied. IMPRESSION: 1. Successful placement of 12 French drainage catheter into the right subhepatic fluid and gas collection with aspiration of 80 mL purulent foul-smelling fluid. 2. Successful placement of a 12 French drainage catheter into the pelvic cul-de-sac fluid collection with aspiration of 40 mL of purulent foul-smelling fluid. Signed, Sterling Big, MD, RPVI [facility] Radiology Electronically Signed   By: Malachy Moan M.D.   On: 05/17/2020 16:16   CT IMAGE GUIDED DRAINAGE BY PERCUTANEOUS CATHETER  Result Date: 05/17/2020 INDICATION: 29 year old male with perforated appendicitis. He has a fluid and air collection in the right subhepatic space from the appendiceal tip as well as a dependent complex fluid collection in the pelvic cul-de-sac. He presents for placement of 2 separate CT-guided drainage catheters. EXAM: CT-guided drain - right subhepatic space CT-guided drain - pelvic cul-de-sac MEDICATIONS: The patient is currently admitted to the hospital and receiving intravenous antibiotics. The antibiotics were administered within an  appropriate time frame prior to the initiation of the procedure. ANESTHESIA/SEDATION: Fentanyl 150 mcg IV; Versed 3 mg IV Moderate Sedation Time:  45 minutes The patient was continuously monitored during the procedure by the interventional radiology nurse under my direct supervision. COMPLICATIONS: None immediate. PROCEDURE: Informed written consent was obtained from the patient after a thorough discussion of the procedural risks, benefits and alternatives. All questions were addressed. Maximal Sterile Barrier Technique was utilized including caps, mask, sterile gowns, sterile gloves, sterile drape, hand hygiene and skin antiseptic. A timeout was performed prior to the initiation of the procedure. A planning axial CT scan was performed. The fluid and gas collection in the right subhepatic space was successfully identified. A suitable skin entry site was selected and marked. The overlying skin was sterilely prepped and draped in the standard fashion using chlorhexidine skin prep. Local anesthesia was attained by infiltration with 1% lidocaine. A small dermatotomy was made. Under intermittent CT guidance, an 18 gauge trocar needle was advanced into the fluid and gas collection. A 0.035 wire was then coiled in the collection. The skin tract was dilated to 12 [place]. A 12 French drainage catheter was advanced over the wire and formed. There was return of approximately 80 mL of foul-smelling purulent fluid. A sample was sent for Gram stain and culture. The catheter was flushed, secured to the skin with 0 Prolene suture and connected to JP bulb suction. The patient was then repositioned into the left lateral decubitus position. Repeat CT imaging was performed of the pelvis. The pelvic cul-de-sac fluid collection was identified. A suitable skin entry site was selected and marked. The  overlying skin was sterilely prepped and draped in the standard fashion using chlorhexidine skin prep. Local anesthesia was attained by  infiltration with 1% lidocaine. A small dermatotomy was made. Under intermittent CT guidance, an 18 gauge trocar needle was advanced along a Peri sacral trajectory through the most medial aspect of the sacral spinous ligament and into the perirectal fluid collection. A 0.035 wire was advanced. The tract was dilated to 62 [place]. A Cook 12 [place] all-purpose drainage catheter was advanced over the wire and formed. Aspiration yields approximately 40 mL of thick, foul smelling purulent fluid. The catheter was gently flushed, secured to the skin with 0 Prolene suture and connected to JP bulb suction. Bandages were applied. IMPRESSION: 1. Successful placement of 12 French drainage catheter into the right subhepatic fluid and gas collection with aspiration of 80 mL purulent foul-smelling fluid. 2. Successful placement of a 12 French drainage catheter into the pelvic cul-de-sac fluid collection with aspiration of 40 mL of purulent foul-smelling fluid. Signed, Criselda Peaches, MD, [place] [facility] Radiology Electronically Signed   By: Jacqulynn Cadet M.D.   On: 05/17/2020 16:16    Anti-infectives: Anti-infectives (From admission, onward)   Start     Dose/Rate Route Frequency Ordered Stop   05/17/20 0500  piperacillin-tazobactam (ZOSYN) IVPB 3.375 g     Discontinue     3.375 g 12.5 mL/hr over 240 Minutes Intravenous Every 8 hours 05/16/20 2202     05/16/20 2215  piperacillin-tazobactam (ZOSYN) IVPB 3.375 g        3.375 g 100 mL/hr over 30 Minutes Intravenous  Once 05/16/20 2200 05/16/20 2240      Assessment/Plan: SIRS AKI- improved, Cr 0.99, continue IVF Perforated appendicitis with intra-abdominal abscess -cont' drain -con't abx  ID -zosyn 6/10>> FEN -IVF, adv diet VTE -SCDs, hold lovenox for possible procedure Foley -none Follow up -TBD  Plan- WBC trending down, con't IV abx today hopefully home tomorrow if con't to do OK   LOS:  3 days    Ralene Ok 05/19/2020

## 2020-05-20 LAB — CBC
HCT: 33.6 % — ABNORMAL LOW (ref 39.0–52.0)
Hemoglobin: 11.4 g/dL — ABNORMAL LOW (ref 13.0–17.0)
MCH: 29.2 pg (ref 26.0–34.0)
MCHC: 33.9 g/dL (ref 30.0–36.0)
MCV: 86.2 fL (ref 80.0–100.0)
Platelets: 271 10*3/uL (ref 150–400)
RBC: 3.9 MIL/uL — ABNORMAL LOW (ref 4.22–5.81)
RDW: 13.2 % (ref 11.5–15.5)
WBC: 13.1 10*3/uL — ABNORMAL HIGH (ref 4.0–10.5)
nRBC: 0 % (ref 0.0–0.2)

## 2020-05-20 LAB — BASIC METABOLIC PANEL
Anion gap: 9 (ref 5–15)
BUN: 8 mg/dL (ref 6–20)
CO2: 22 mmol/L (ref 22–32)
Calcium: 8.2 mg/dL — ABNORMAL LOW (ref 8.9–10.3)
Chloride: 104 mmol/L (ref 98–111)
Creatinine, Ser: 0.77 mg/dL (ref 0.61–1.24)
GFR calc Af Amer: >60 mL/min (ref >60)
GFR calc non Af Amer: >60 mL/min (ref >60)
Glucose, Bld: 105 mg/dL — ABNORMAL HIGH (ref 70–99)
Potassium: 3.6 mmol/L (ref 3.5–5.1)
Sodium: 135 mmol/L (ref 135–145)

## 2020-05-20 MED ORDER — OXYCODONE HCL 5 MG PO TABS: 5.0000 mg | ORAL_TABLET | Freq: Four times a day (QID) | ORAL | 0 refills | Status: DC | PRN

## 2020-05-20 MED ORDER — AMOXICILLIN-POT CLAVULANATE 875-125 MG PO TABS: 1.0000 | ORAL_TABLET | Freq: Two times a day (BID) | ORAL | 0 refills | Status: AC

## 2020-05-20 MED ORDER — SODIUM CHLORIDE 0.9% FLUSH: 5.0000 mL | Freq: Two times a day (BID) | INTRAVENOUS | 0 refills | Status: DC

## 2020-05-20 MED ORDER — ACETAMINOPHEN 325 MG PO TABS: 650.0000 mg | ORAL_TABLET | Freq: Four times a day (QID) | ORAL | Status: DC | PRN

## 2020-05-20 MED FILL — oxyCODONE HCL 5 MG TABS: 5 | 4 days supply | Qty: 30 | Fill #0

## 2020-05-20 MED FILL — AMOX-CLAV 875-125 MG TABLET: 875-125 | 10 days supply | Qty: 20 | Fill #0

## 2020-05-20 NOTE — Discharge Instructions (Signed)
Apendicitis, en los adultos Appendicitis, Adult  El apndice es un tubo cilndrico en el cuerpo que tiene forma de dedo. Est conectado al intestino grueso. El trmino apendicitis significa que este tubo cilndrico est hinchado (inflamado). Si no se trata, el tubo se puede desgarrar (ruptura). Esto puede derivar en una infeccin potencialmente mortal. Esta afeccin tambin puede causar la acumulacin de pus en el apndice (absceso). Cules son las causas? Esta afeccin puede deberse a algo que obstruye el apndice. Esto incluye lo siguiente:  Una masa de materia fecal.  Ganglios linfticos ms grandes que lo normal. Algunas veces, la causa no se conoce. Qu incrementa el riesgo? Es ms probable que presente esta afeccin si tiene entre 10 y 26 aos de edad. Cules son los signos o los sntomas? Los sntomas de esta afeccin incluyen:  Dolor alrededor del ombligo. ? El dolor se traslada hacia la parte inferior derecha del vientre (abdomen). ? El dolor puede empeorar con el [place]. ? El dolor puede empeorar al toser. ? El dolor puede empeorar al moverse bruscamente.  Dolor a la palpacin en la zona inferior derecha del abdomen.  Ganas de vomitar (nuseas).  Vmitos.  Falta de apetito (inapetencia).  Cristy Hilts.  Tener dificultad para defecar (estreimiento).  Materia fecal lquida (diarrea).  Malestar general. Cmo se trata? En la [employer], [place] afeccin se trata mediante la extirpacin del apndice (apendicectoma). Hay dos modos de Optometrist esto:  [place]. Con este mtodo, el apndice se extirpa a travs de un corte grande (incisin). El corte se realiza en la zona inferior derecha del abdomen. Esta ciruga se puede utilizar si: ? Tiene cicatrices de [place]. ? Tiene una enfermedad hemorrgica. ? Est embarazada, y el beb nacer pronto. ? Tiene una enfermedad que no dificulta realizar el otro tipo de [place].  Ciruga laparoscpica. Con este  mtodo, el apndice se extirpa a travs de pequeos cortes. A menudo, esta ciruga: ? Best boy. ? [employer]. ? La recuperacin es ms fcil. Si se produce la ruptura del apndice y se forma pus:  Pueden colocarle un drenaje en la llaga. El drenaje se usar para eliminar el pus.  Pueden administrarle un antibitico a travs de una va intravenosa (i.v.).  La extirpacin del apndice puede o no ser necesaria. Siga estas indicaciones en su casa: Si se someti a [place], siga las indicaciones del mdico sobre cmo cuidarse en su casa y cmo cuidar el corte de la [place]. Medicamentos  [employer] de venta libre y los recetados solamente como se lo haya indicado el mdico.  Si le recetaron un antibitico, tmelo como se lo haya indicado el mdico. No deje de tomar el antibitico aunque comience a sentirse mejor. Comida y bebida Siga las indicaciones del mdico respecto de lo que no puede comer o beber. Puede retornar lentamente a su dieta si:  Ya no siente Engineer, site.  Ha dejado de vomitar. Indicaciones generales  No consuma ningn producto que contenga nicotina o tabaco, como cigarrillos, cigarrillos electrnicos y tabaco de Higher education careers adviser. Si necesita ayuda para dejar de fumar, consulte al mdico.  No conduzca ni use maquinaria pesada mientras toma analgsicos recetados.  Consulte al mdico si el medicamento que est tomando puede causarle problemas para defecar. Es posible que deba tomar medidas para prevenir o tratar los problemas para defecar: ? Beba suficiente lquido para Contractor pis (la orina) de color amarillo plido. ? Tome medicamentos recetados o de venta [place]. ?  Coma alimentos ricos en fibra. Entre ellos, frijoles, cereales integrales y frutas y verduras frescas. ? Limite los alimentos con alto contenido de [place] y International aid/development worker. Estos incluyen alimentos fritos o dulces.  Concurra a todas las visitas de control como se lo haya indicado  el mdico. Esto es importante. Comunquese con un mdico si:  Observa pus, sangre o una gran cantidad de lquido que sale del corte o de los cortes de la [place].  Siente Journalist, newspaper o vomita. Solicite ayuda inmediatamente si:  Scientific laboratory technician, y Community education officer.  Tiene fiebre.  Tiene escalofros.  Se siente muy cansado.  Siente dolores musculares.  Le falta el aire. Resumen  La apendicitis es la inflamacin del apndice. El apndice es un tubo cilndrico que tiene forma de dedo. Est conectado al intestino grueso.  Esta afeccin puede deberse a algo que obstruye el apndice. Esto puede causar una infeccin.  Generalmente, esta afeccin se trata mediante la extirpacin del apndice. Esta informacin no tiene Theme park manager el consejo del mdico. Asegrese de hacerle al mdico cualquier pregunta que tenga. Document Revised: 07/04/2018 Document Reviewed: 07/04/2018 Elsevier Patient Education  2020 Elsevier Inc.   Drenaje percutneo de absceso, cuidados posteriores Percutaneous Abscess Drain, Care After Lea esta informacin sobre cmo cuidarse despus del procedimiento. Su mdico tambin podr darle indicaciones ms especficas. Comunquese con su mdico si tiene problemas o preguntas. Qu puedo esperar despus del procedimiento? Despus del procedimiento, es comn [employer] siguientes sntomas:  Una pequea cantidad de moretones y Associate Professor en la zona donde se coloc el tubo de drenaje (catter).  Somnolencia y Management consultant. Esto debera desaparecer una vez que hayan desaparecido los efectos de los medicamentos administrados. Siga estas indicaciones en su casa: Cuidados de la incisin  Siga las indicaciones del mdico acerca del cuidado de la incisin. Haga lo siguiente: ? Lvese las manos con agua y jabn antes de Multimedia programmer las vendas (vendajes). Use desinfectante para manos si no dispone de [place] y [place]. ? Cambie el vendaje como se lo haya indicado  el mdico. ? No retire los puntos (suturas), la goma para cerrar la piel o las tiras [place]. Es posible que estos cierres cutneos Conservation officer, nature en la piel durante 2semanas o ms tiempo. Si los bordes de las tiras [address] empiezan a despegarse y Scientific laboratory technician, puede recortar los que estn sueltos. No retire las tiras [employer] por completo a menos que el mdico se lo indique.  Controle todos los das la zona de la incisin para detectar signos de infeccin. Est atento a los siguientes signos: ? Aumento del enrojecimiento, de la hinchazn o del dolor. ? Mayor presencia de lquido o [place]. ? [place]. ? Pus o mal olor. ? Prdida de lquido alrededor del catter (en vez de que el lquido drene a travs del catter). Cuidado del catter   Siga las indicaciones del mdico acerca de cmo vaciar y limpiar el catter y la bolsa de Production manager. Es posible que deba limpiar el catter [employer] para que no se [place].  Si se lo indican, anote la siguiente informacin cada vez que vace la bolsa: ? La fecha y la hora. ? La cantidad de drenaje. Instrucciones generales  Descanse en su casa durante 1 a 2 das despus del procedimiento. Retome sus actividades normales como se lo haya indicado el mdico.  No tome baos de inmersin, no practique natacin ni use el jacuzzi durante 24horas despus del procedimiento o hasta que el mdico lo autorice.  Tome los medicamentos de venta libre y los recetados solamente como se lo haya indicado el mdico.  Consulting civil engineer a todas las visitas de control como se lo haya indicado el mdico. Esto es importante. Comunquese con un mdico si:  Tiene menos de 24ml de drenaje por da durante 2 a [place] enrojecimiento, la hinchazn o Conservation officer, historic buildings alrededor de la incisin [place].  Le sale ms lquido o sangre de la zona de la incisin.  La zona de la incisin est caliente al tacto.  Tiene pus o percibe que sale  mal olor de la incisin.  Tiene prdida de lquido alrededor del catter (en vez de que el lquido drene a travs del catter).  Tiene fiebre o siente escalofros.  El dolor no mejora con medicamentos. Solicite ayuda de inmediato si:  El catter se Therapist, occupational.  El catter deja de drenar repentinamente.  Le drena sangre junto con el lquido del catter repentinamente.  Se siente mareado o se desmaya.  Presenta una erupcin cutnea.  Tiene nuseas o vmitos.  Tiene dificultad para respirar o Risk manager.  Siente dolor en el pecho.  Tiene problemas visuales o para hablar.  Tiene problemas de equilibrio, o dificultad para mover los brazos o las piernas. Resumen  Es comn tener una pequea cantidad de moretones y Scientist, research (life sciences) en la zona donde se coloc el tubo de drenaje (catter).  Es posible que le pidan que registre la cantidad de drenaje de la bolsa cada vez que la vace.  Siga las indicaciones del mdico acerca de cmo vaciar y limpiar el catter y la bolsa de Theatre stage manager. Esta informacin no tiene Marine scientist el consejo del mdico. Asegrese de hacerle al mdico cualquier pregunta que tenga. Document Revised: 08/31/2017 Document Reviewed: 08/31/2017 Elsevier Patient Education  2020 [place]   Registro de drenaje quirrgico  Drain Record Vace el drenaje quirrgico como se lo haya indicado el mdico. Use este formulario para Museum/gallery curator la cantidad de lquido que haya juntado en el recipiente de drenaje. Traiga este formulario a las visitas de seguimiento.   Toma Copier __________ Mellody Drown __________ VOJJKKXF __________ Toma Copier __________ Mellody Drown __________ GHWEXHBZ __________ Toma Copier __________ Mellody Drown __________ JIRCVELF __________ Toma Copier __________ YBOF __________ BPZWCHEN __________ Toma Copier __________ IDPO __________ EUMPNTIR __________ Toma Copier __________ WERX __________ VQMGQQPY __________ Toma Copier __________ Hora __________ PPJKDTOI __________ Toma Copier __________ Hora  __________ ZTIWPYKD __________ Toma Copier __________ Hora __________ XIPJASNK __________ Toma Copier __________ Hora __________ NLZJQBHA __________ Toma Copier __________ Hora __________ LPFXTKWI __________ Toma Copier __________ Hora __________ OXBDZHGD __________ Toma Copier __________ Hora __________ JMEQASTM __________ Toma Copier __________ Hora __________ HDQQIWLN __________ Toma Copier __________ Hora __________ LGXQJJHE __________ Toma Copier __________ Hora __________ RDEYCXKG __________ Toma Copier __________ YJEH __________ UDJSHFWY __________ Toma Copier __________ OVZC __________ HYIFOYDX __________ Toma Copier __________ AJOI __________ NOMVEHMC __________ Toma Copier __________ NOBS __________ JGGEZMOQ __________ Toma Copier __________ HUTM __________ LYYTKPTW __________ Toma Copier __________ SFKC __________ LEXNTZGY __________ Toma Copier __________ FVCB __________ SWHQPRFF __________ Toma Copier __________ MBWG __________ YKZLDJTT __________ Toma Copier __________ SVXB __________ LTJQZESP __________ Toma Copier __________ QZRA __________ QTMAUQJF __________ Toma Copier __________ HLKT __________ GYBWLSLH __________ Toma Copier __________ TDSK __________ AJGOTLXB __________ Toma Copier __________ WIOM __________ BTDHRCBU __________ Toma Copier __________ LAGT __________ XMIWOEHO __________ Toma Copier __________ ZYYQ __________ MGNOIBBC __________ Toma Copier __________ WUGQ __________ BVQXIHWT __________ Toma Copier __________ UUEK __________ CMKLKJZP __________ Toma Copier __________ HXTA __________ VWPVXYIA __________ Toma Copier __________ XKPV __________ VZSMOLMB __________ Toma Copier __________ EMLJ __________ QGBEEFEO __________ Toma Copier __________ FHQR __________ FXJOITGP __________ Toma Copier __________ Hora __________ [place] __________ Toma Copier __________ Mellody Drown  __________ Heloise Ochoa __________ Franco Nones __________ Mammie Russian __________ [place] __________ Franco Nones __________ Mammie Russian __________ [place] __________ Franco Nones __________ Hora __________ [place] __________ Esta informacin no tiene como fin reemplazar el consejo del mdico. Asegrese de  hacerle al mdico cualquier pregunta que tenga. Document Revised: 03/05/2018 Document Reviewed: 11/17/2017 Elsevier Patient Education  2020 [employer].

## 2020-05-20 NOTE — Plan of Care (Signed)
DISCHARGE NOTE HOME Jonathan Hampton to be discharged home per MD order. Discussed prescriptions and follow up appointments with the patient. Prescriptions given to patient; medication list explained in detail. Patient verbalized understanding.  Skin clean, dry and intact without evidence of skin break down, no evidence of skin tears noted. IV catheter discontinued intact. Site without signs and symptoms of complications. Dressing and pressure applied. Pt denies pain at the site currently. No complaints noted.  Patient free of lines, drains, and wounds.   An After Visit Summary (AVS) was printed and given to the patient. Patient escorted via wheelchair, and discharged home via private auto.  Educated Jonathan Hampton (friend) on how to flush drains, record drainage, and change dressings.  All questions answered.  Gave enough supplies for two weeks.  Jonathan Colt, RN

## 2020-05-20 NOTE — Progress Notes (Signed)
IR.  Patient with history of perforated appendicitis with associated intra-abdominal abscess(es) s/p left TG and suphepatic drain placements in IR 05/17/2020 by Dr. Archer Asa.  If patient is to be discharged, below are discharge instructions: - Flush each drain once daily with 5-10 cc NS flush (patient will need an order for flushes upon discharge). RN aware to teach patient how to manage drains at home. - Record output from each drain once daily. - Follow-up at drain clinic 10-14 days after discharge for CT/possible drain injection (assess for possible drain removal)- order placed to facilitate this.  Please call IR with questions/concerns.   Waylan Boga Sai Moura, PA-C 05/20/2020, 9:15 AM

## 2020-05-20 NOTE — Discharge Summary (Signed)
[facility] Discharge Summary   Patient ID: Jovanie Verge MRN: [id number] DOB/AGE: Oct 04, 1991 29 y.o.  Admit date: 05/16/2020 Discharge date: 05/20/2020  Admitting Diagnosis: Perforated appendicitis  Discharge Diagnosis Patient Active Problem List   Diagnosis Date Noted  . Perforated appendicitis 05/16/2020    Consultants Interventional radiology  Imaging: No results found.  Procedures Dr. Vilma Prader McCulllough (05/17/20) - Percutaneous abscess drain x2  Hospital Course:  Patient is a 29 year old male who presented to [facility] with abdominal pain.  Workup showed perforated appendicitis with abscess. Interventional radiology consulted for percutaneous drainage. Patient was admitted and underwent procedure listed above. Tolerated procedure well and was transferred to the floor.  Diet was advanced as tolerated.  On 05/20/20, the patient was voiding well, tolerating diet, ambulating well, pain well controlled, vital signs stable, drains intact and felt stable for discharge home.  Patient will follow up in our office in 3 weeks and knows to call with questions or concerns.  He will call to confirm appointment date/time.    Physical Exam: General: pleasant, WD, WN hispanic male who is laying in bed in NAD HEENT: Sclera are noninjected.  PERRL.  Ears and nose without any masses or lesions.  Mouth is pink and moist Heart: regular, rate, and rhythm.  Normal s1,s2. No obvious murmurs, gallops, or rubs noted.  Palpable radial and pedal pulses bilaterally Lungs: CTAB, no wheezes, rhonchi, or rales noted.  Respiratory effort nonlabored Abd: soft, NT, ND, +BS, TG drain with minimal drainage MS: all 4 extremities are symmetrical with no cyanosis, clubbing, or edema. Skin: warm and dry with no masses, lesions, or rashes Neuro: Cranial nerves 2-12 grossly intact, sensation is normal throughout Psych: A&Ox3 with an appropriate affect.   I or a member of my team have reviewed this  patient in the Controlled Substance Database.   Allergies as of 05/20/2020   No Known Allergies     Medication List    TAKE these medications   acetaminophen 325 MG tablet Commonly known as: TYLENOL Take 2 tablets (650 mg total) by mouth every 6 (six) hours as needed for mild pain (or temp > 100).   amoxicillin-clavulanate 875-125 MG tablet Commonly known as: Augmentin Take 1 tablet by mouth every 12 (twelve) hours for 10 days.   oxyCODONE 5 MG immediate release tablet Commonly known as: Oxy IR/ROXICODONE Take 1-2 tablets (5-10 mg total) by mouth every 6 (six) hours as needed for moderate pain.   sodium chloride flush 0.9 % Soln Commonly known as: NS 5 mLs by Intracatheter route every 12 (twelve) hours.         Follow-up Information    Axel Filler, MD. Go on 06/14/2020.   Specialty: General Surgery Why: Follow up appointment scheduled for 9:40 AM. Please arrive 30 min prior to appointment time. Bring photo ID and any insurance information.  Contact information: [address] [place] [address] [phone]        [facility] Follow up in 10 day(s).   Why: Please follow-up at drain clinic 10-14 days after discharge. Our office will call you to set up this appointment. Contact information: [address] [place] [address] [phone]               Signed: Wells Guiles, [facility] 05/20/2020, 1:35 PM Please see Amion for pager number during day hours 7:00am-4:30pm

## 2020-05-21 ENCOUNTER — Other Ambulatory Visit: Payer: Self-pay | Admitting: General Surgery

## 2020-05-21 DIAGNOSIS — K3532 Acute appendicitis with perforation and localized peritonitis, without abscess: Secondary | ICD-10-CM

## 2020-05-21 LAB — AEROBIC/ANAEROBIC CULTURE W GRAM STAIN (SURGICAL/DEEP WOUND)

## 2020-06-03 MED FILL — NORMAL SALINE FLUSH SYRINGE: 0.9 | 10 days supply | Qty: 200 | Fill #0

## 2020-06-05 ENCOUNTER — Ambulatory Visit
Admission: RE | Admit: 2020-06-05 | Discharge: 2020-06-05 | Disposition: A | Discharge status: Home / Self Care | Payer: Self-pay | Source: Ambulatory Visit | Attending: Student | Admitting: Student

## 2020-06-05 ENCOUNTER — Ambulatory Visit
Admission: RE | Admit: 2020-06-05 | Discharge: 2020-06-05 | Disposition: A | Discharge status: Home / Self Care | Payer: Self-pay | Source: Ambulatory Visit | Attending: General Surgery | Admitting: General Surgery

## 2020-06-05 ENCOUNTER — Other Ambulatory Visit: Payer: Self-pay | Admitting: General Surgery

## 2020-06-05 DIAGNOSIS — K3532 Acute appendicitis with perforation and localized peritonitis, without abscess: Secondary | ICD-10-CM

## 2020-06-05 HISTORY — PX: IR RADIOLOGIST EVAL & MGMT: IMG5224

## 2020-06-05 MED ORDER — IOPAMIDOL (ISOVUE-300) INJECTION 61%
100.0000 mL | Freq: Once | INTRAVENOUS | Status: AC | PRN
  Administered 2020-06-05: 100 mL via INTRAVENOUS

## 2020-06-05 NOTE — Progress Notes (Signed)
Chief Complaint: Ruptured appendicitis, with 2 drains  Referring Physician(s): Dr. Derrell Lollingamirez  History of Present Illness: Jonathan Hampton is a 29 y.o. male presenting to [facility] drain clinic for follow up of his CT guided drainage.   He was [facility] to [facility] 6/11 - 6/14 for ruptured appendicitis.  CT drainage was performed 6/11 for sub-hepatic abscess and pelvic abscess.   By way of interpreter today, he tells me that he is feeling just fine, denies fever.  He is no longer taking ABX.  The right anterior fluid is scant, <10cc daily.  The left posterior drain is scant, <2cc per day.  He continues to flush.   CT shows us that both of the collections are resolved.    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   He knows his follow up is on 7/9.   Discharge summary confirms.    Axel Filleramirez, Armando, MD. Go on 06/14/2020.   Specialty: General Surgery Why: Follow up appointment scheduled for 9:40 AM. Please arrive 30 min prior to appointment time. Bring photo ID and any insurance information.  Contact information: [address] [place] [id number] [phone]      No past medical history on file.  No past surgical history on file.  Allergies: Patient has no known allergies.  Medications: Prior to Admission medications   Medication Sig Start Date End Date Taking? Authorizing Provider  acetaminophen (TYLENOL) 325 MG tablet Take 2 tablets (650 mg total) by mouth every 6 (six) hours as needed for mild pain (or temp > 100). 05/20/20   Juliet RudeJohnson, Kelly R, PA-C  oxyCODONE (OXY IR/ROXICODONE) 5 MG immediate release tablet Take 1-2 tablets (5-10 mg total) by mouth every 6 (six) hours as needed for moderate pain. 05/20/20   Juliet RudeJohnson, Kelly R, PA-C  sodium chloride flush (NS) 0.9 % SOLN 5 mLs by Intracatheter route every 12 (twelve) hours. 05/20/20   Juliet RudeJohnson, Kelly R, PA-C     No family history on file.  Social  History   Socioeconomic History   Marital status: Single    Spouse name: Not on file   Number of children: Not on file   Years of education: Not on file   Highest education level: Not on file  Occupational History   Not on file  Tobacco Use   Smoking status: Current Every Day Smoker   Smokeless tobacco: Never Used  Substance and Sexual Activity   Alcohol use: Yes   Drug use: Not Currently   Sexual activity: Not on file  Other Topics Concern   Not on file  Social History Narrative   Not on file   Social Determinants of Health   Financial Resource Strain:    Difficulty of Paying Living Expenses:   Food Insecurity:    Worried About Running Out of Food in the Last Year:    Baristaan Out of Food in the Last Year:   Transportation Needs:    Freight forwarderLack of Transportation (Medical):    Lack of Transportation (Non-Medical):   Physical Activity:    Days of Exercise per Week:    Minutes of Exercise per Session:   Stress:    Feeling of Stress :   Social Connections:    Frequency of Communication with Friends and Family:    Frequency of Social Gatherings with Friends and Family:    Attends Religious Services:    Active Member of Clubs or  Organizations:    Attends Engineer, structural:    Marital Status:        Review of Systems: A 12 point ROS discussed and pertinent positives are indicated in the HPI above.  All other systems are negative.  Review of Systems  Vital Signs: There were no vitals taken for this visit.  Physical Exam General: 29 yo male appearing stated age.  Well-developed, well-nourished.  No distress. HEENT: Atraumatic, normocephalic.  Conjugate gaze, extra-ocular motor intact. No scleral icterus or scleral injection. No lesions on external ears, nose, lips, or gums.  Oral mucosa moist, pink.  Neck: Symmetric with no goiter enlargement.  Chest/Lungs:  Symmetric chest with inspiration/expiration.    Heart:    No JVD appreciated.    Abdomen:  Soft, NT/ND, with + bowel sounds.   Genito-urinary: Deferred Neurologic: Alert & Oriented to person, place, and time.   Normal affect and insight.  Appropriate questions.  Moving all 4 extremities with gross sensory intact.  Drain site:  Both drain sites are clean.  No erythema.  No granulation tissue.        Imaging: CT ABDOMEN PELVIS W CONTRAST  Result Date: 05/16/2020 CLINICAL DATA:  Abdominal pain. EXAM: CT ABDOMEN AND PELVIS WITH CONTRAST TECHNIQUE: Multidetector CT imaging of the abdomen and pelvis was performed using the standard protocol following bolus administration of intravenous contrast. CONTRAST:  OMNIPAQUE IOHEXOL 300 MG/ML  SOLN COMPARISON:  None. FINDINGS: Lower chest: There is a small right-sided pleural effusion.There is atelectasis at the lung bases. Hepatobiliary: The liver is normal. Normal gallbladder.There is no biliary ductal dilation. Pancreas: Normal contours without ductal dilatation. No peripancreatic fluid collection. Spleen: Unremarkable. Adrenals/Urinary Tract: --Adrenal glands: Unremarkable. --Right kidney/ureter: No hydronephrosis or radiopaque kidney stones. --Left kidney/ureter: No hydronephrosis or radiopaque kidney stones. --Urinary bladder: Unremarkable. Stomach/Bowel: --Stomach/Duodenum: There is some mild diffuse wall thickening of the distal esophagus. The visualized portions of the stomach are unremarkable. --Small bowel: There are mildly dilated loops of small bowel scattered throughout the abdomen. --Colon: There is mild wall thickening of the ascending colon, likely reactive in etiology. --Appendix: There are findings of perforated acute appendicitis. There is a forming abscess along the inferior margin of the right hepatic lobe measuring approximately 8.2 by 2.8 cm (coronal series 6, image 64). There are pockets of free air in the upper abdomen. There is a small amount of free fluid in the abdomen and pelvis. Vascular/Lymphatic: Normal course  and caliber of the major abdominal vessels. --No retroperitoneal lymphadenopathy. --No mesenteric lymphadenopathy. --No pelvic or inguinal lymphadenopathy. Reproductive: Unremarkable Other: Free fluid and free air is noted in the abdomen and pelvis. The abdominal wall is normal. Musculoskeletal. 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 There is no acute displaced fracture. IMPRESSION: 1. Findings consistent with perforated acute appendicitis. There is an 8 cm abscess at the inferior margin of the right hepatic lobe. There is free air and free fluid in the abdomen and pelvis. 2. Small right-sided pleural effusion with adjacent atelectasis. 3. Low-grade small-bowel ileus. 4. Wall thickening of the cecum and ascending colon, likely reactive. 5. 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 These results were called by telephone at the time of interpretation on 05/16/2020 at 9:54 pm to provider [facility] , who verbally acknowledged these results. Electronically Signed   By: Katherine Mantle M.D.   On: 05/16/2020 21:58   DG Chest [facility] [address]  Result Date: 05/16/2020 CLINICAL DATA:  Abdominal pain and distension.  Tachycardia. EXAM: PORTABLE CHEST 1 VIEW COMPARISON:  None. FINDINGS: Shallow lung inflation with bibasilar atelectasis. No focal airspace consolidation. No pulmonary edema or pleural effusion. Normal cardiomediastinal contours. No pneumothorax. IMPRESSION: Shallow lung inflation with bibasilar atelectasis. Electronically Signed   By: Deatra Robinson M.D.   On: 05/16/2020 20:41   CT IMAGE GUIDED DRAINAGE BY PERCUTANEOUS CATHETER  Result Date: 05/17/2020 INDICATION: 29 year old male with perforated appendicitis. He has a fluid and air collection in the right subhepatic space from the appendiceal tip as well as a dependent complex fluid collection in the pelvic cul-de-sac. He presents for placement of 2 separate CT-guided drainage  catheters. EXAM: CT-guided drain - right subhepatic space CT-guided drain - pelvic cul-de-sac MEDICATIONS: The patient is currently admitted to the hospital and receiving intravenous antibiotics. The antibiotics were administered within an appropriate time frame prior to the initiation of the procedure. ANESTHESIA/SEDATION: Fentanyl 150 mcg IV; Versed 3 mg IV Moderate Sedation Time:  45 minutes The patient was continuously monitored during the procedure by the interventional radiology nurse under my direct supervision. COMPLICATIONS: None immediate. PROCEDURE: Informed written consent was obtained from the patient after a thorough discussion of the procedural risks, benefits and alternatives. All questions were addressed. Maximal Sterile Barrier Technique was utilized including caps, mask, sterile gowns, sterile gloves, sterile drape, hand hygiene and skin antiseptic. A timeout was performed prior to the initiation of the procedure. A planning axial CT scan was performed. The fluid and gas collection in the right subhepatic space was successfully identified. A suitable skin entry site was selected and marked. The overlying skin was sterilely prepped and draped in the standard fashion using chlorhexidine skin prep. Local anesthesia was attained by infiltration with 1% lidocaine. A small dermatotomy was made. Under intermittent CT guidance, an 18 gauge trocar needle was advanced into the fluid and gas collection. A 0.035 wire was then coiled in the collection. The skin tract was dilated to 12 [place]. A 12 French drainage catheter was advanced over the wire and formed. There was return of approximately 80 mL of foul-smelling purulent fluid. A sample was sent for Gram stain and culture. The catheter was flushed, secured to the skin with 0 Prolene suture and connected to JP bulb suction. The patient was then repositioned into the left lateral decubitus position. Repeat CT imaging was performed of the pelvis. The pelvic  cul-de-sac fluid collection was identified. A suitable skin entry site was selected and marked. The overlying skin was sterilely prepped and draped in the standard fashion using chlorhexidine skin prep. Local anesthesia was attained by infiltration with 1% lidocaine. A small dermatotomy was made. Under intermittent CT guidance, an 18 gauge trocar needle was advanced along a Peri sacral trajectory through the most medial aspect of the sacral spinous ligament and into the perirectal fluid collection. A 0.035 wire was advanced. The tract was dilated to 70 [place]. A Cook 12 [place] all-purpose drainage catheter was advanced over the wire and formed. Aspiration yields approximately 40 mL of thick, foul smelling purulent fluid. The catheter was gently flushed, secured to the skin with 0 Prolene suture and connected to JP bulb suction. Bandages were applied. IMPRESSION: 1. Successful placement of 12 French drainage catheter into the right subhepatic fluid and gas collection with aspiration of 80 mL purulent foul-smelling fluid. 2. Successful placement of a 12 French drainage catheter into the pelvic cul-de-sac fluid collection with aspiration of 40 mL of purulent foul-smelling  fluid. Signed, Sterling Big, MD, RPVI [facility] Electronically Signed   By: Malachy Moan M.D.   On: 05/17/2020 16:16   CT IMAGE GUIDED DRAINAGE BY PERCUTANEOUS CATHETER  Result Date: 05/17/2020 INDICATION: 29 year old male with perforated appendicitis. He has a fluid and air collection in the right subhepatic space from the appendiceal tip as well as a dependent complex fluid collection in the pelvic cul-de-sac. He presents for placement of 2 separate CT-guided drainage catheters. EXAM: CT-guided drain - right subhepatic space CT-guided drain - pelvic cul-de-sac MEDICATIONS: The patient is currently admitted to the hospital and receiving intravenous antibiotics. The  antibiotics were administered within an appropriate time frame prior to the initiation of the procedure. ANESTHESIA/SEDATION: Fentanyl 150 mcg IV; Versed 3 mg IV Moderate Sedation Time:  45 minutes The patient was continuously monitored during the procedure by the interventional radiology nurse under my direct supervision. COMPLICATIONS: None immediate. PROCEDURE: Informed written consent was obtained from the patient after a thorough discussion of the procedural risks, benefits and alternatives. All questions were addressed. Maximal Sterile Barrier Technique was utilized including caps, mask, sterile gowns, sterile gloves, sterile drape, hand hygiene and skin antiseptic. A timeout was performed prior to the initiation of the procedure. A planning axial CT scan was performed. The fluid and gas collection in the right subhepatic space was successfully identified. A suitable skin entry site was selected and marked. The overlying skin was sterilely prepped and draped in the standard fashion using chlorhexidine skin prep. Local anesthesia was attained by infiltration with 1% lidocaine. A small dermatotomy was made. Under intermittent CT guidance, an 18 gauge trocar needle was advanced into the fluid and gas collection. A 0.035 wire was then coiled in the collection. The skin tract was dilated to 12 [place]. A 12 French drainage catheter was advanced over the wire and formed. There was return of approximately 80 mL of foul-smelling purulent fluid. A sample was sent for Gram stain and culture. The catheter was flushed, secured to the skin with 0 Prolene suture and connected to JP bulb suction. The patient was then repositioned into the left lateral decubitus position. Repeat CT imaging was performed of the pelvis. The pelvic cul-de-sac fluid collection was identified. A suitable skin entry site was selected and marked. The overlying skin was sterilely prepped and draped in the standard fashion using chlorhexidine skin  prep. Local anesthesia was attained by infiltration with 1% lidocaine. A small dermatotomy was made. Under intermittent CT guidance, an 18 gauge trocar needle was advanced along a Peri sacral trajectory through the most medial aspect of the sacral spinous ligament and into the perirectal fluid collection. A 0.035 wire was advanced. The tract was dilated to 45 [place]. A Cook 12 [place] all-purpose drainage catheter was advanced over the wire and formed. Aspiration yields approximately 40 mL of thick, foul smelling purulent fluid. The catheter was gently flushed, secured to the skin with 0 Prolene suture and connected to JP bulb suction. Bandages were applied. IMPRESSION: 1. Successful placement of 12 French drainage catheter into the right subhepatic fluid and gas collection with aspiration of 80 mL purulent foul-smelling fluid. 2. Successful placement of a 12 French drainage catheter into the pelvic cul-de-sac fluid collection with aspiration of 40 mL of purulent foul-smelling fluid. Signed, Sterling Big, MD, RPVI [facility] Electronically Signed   By: Malachy Moan M.D.   On: 05/17/2020 16:16    Labs:  CBC: Recent  Labs    05/17/20 0421 05/18/20 0305 05/19/20 0442 05/20/20 0410  WBC 12.0* 15.2* 14.1* 13.1*  HGB 12.7* 13.3 11.0* 11.4*  HCT 38.1* 40.0 33.0* 33.6*  PLT 239 297 253 271    COAGS: Recent Labs    05/17/20 0421  INR 1.4*    BMP: Recent Labs    05/17/20 0421 05/18/20 0305 05/19/20 0442 05/20/20 0410  NA 136 137 137 135  K 4.1 3.5 3.9 3.6  CL 98 102 108 104  CO2 26 22 20* 22  GLUCOSE 113* 120* 97 105*  BUN 22* 14 10 8   CALCIUM 8.2* 8.2* 8.1* 8.2*  CREATININE 1.23 0.99 0.74 0.77  GFRNONAA >60 >60 >60 >60  GFRAA >60 >60 >60 >60    LIVER FUNCTION TESTS: Recent Labs    05/16/20 1936  BILITOT 2.4*  AST 27  ALT 36  ALKPHOS 76  PROT 7.7  ALBUMIN 3.7    TUMOR MARKERS: No results for input(s):  AFPTM, CEA, CA199, CHROMGRNA in the last 8760 hours.  Assessment and Plan:  Mr 07/16/20 is a 29 yo Spanish-speaking gentleman, presenting today for follow up of 2 CT guided drains for treatment of abscess related to ruptured appendicitis:  1 in the RUQ subhepatic space, 1 left transgluteal.   Both abscess are resolved, and he is feeling fine.   We removed the left posterior drain.   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  We left this drain in place, and I changed the bulb suction to a bag.  I gave him instructions on care via interpreter.   He understands that he needs to follow up with surgery appointment 7/9.  We will get another appointment on the books for him in 3-4 weeks with drain injection only for possible removal, unless he has surgery booked for appendectomy in the interval.    Electronically Signed: 9/9 06/05/2020, 12:01 PM   I spent a total of  30 Minutes   in face to face in clinical consultation, greater than 50% of which was counseling/coordinating care for abscess drains, ruptured appendicitis

## 2020-06-11 MED FILL — NORMAL SALINE FLUSH SYRINGE: 0.9 | 10 days supply | Qty: 200 | Fill #0

## 2020-06-14 ENCOUNTER — Ambulatory Visit: Payer: Self-pay | Admitting: General Surgery

## 2020-06-14 NOTE — H&P (Signed)
History of Present Illness Axel Filler MD; 06/14/2020 9:56 AM) The patient is a 29 year old male who presents with a complaint of perforated appendicitis. Patient is a 29 year old male who was recently discharged from the hospital after being admitted for perforated appendicitis. Patient had an IR drain placed. Patient continue with antibiotics while in the hospital as well as after discharge. Patient had a follow-up CT scan Wednesday which showed minimal inflammation and no abscess cavity.  Patient states that he is fishes antibodies, has no pain comes had no fevers, is eating normally and has had normal bowel function.     Past Surgical History Santiago Glad, CMA; 06/14/2020 9:49 AM) No pertinent past surgical history   Diagnostic Studies History Santiago Glad, [place]; 06/14/2020 9:49 AM) Colonoscopy  never  Allergies Santiago Glad, CMA; 06/14/2020 9:49 AM) No Known Drug Allergies  [06/14/2020]: Allergies Reconciled   Medication History Santiago Glad, CMA; 06/14/2020 9:49 AM) No Current Medications Medications Reconciled  Other Problems Santiago Glad, CMA; 06/14/2020 9:49 AM) No pertinent past medical history     Review of Systems Santiago Glad CMA; 06/14/2020 9:49 AM) General Not Present- Appetite Loss, Chills, Fatigue, Fever, Night Sweats, Weight Gain and Weight Loss. Skin Not Present- Change in Wart/Mole, Dryness, Hives, Jaundice, New Lesions, Non-Healing Wounds, Rash and Ulcer. HEENT Not Present- Earache, Hearing Loss, Hoarseness, Nose Bleed, Oral Ulcers, Ringing in the Ears, Seasonal Allergies, Sinus Pain, Sore Throat, Visual Disturbances, Wears glasses/contact lenses and Yellow Eyes. Respiratory Not Present- Bloody sputum, Chronic Cough, Difficulty Breathing, Snoring and Wheezing. Breast Not Present- Breast Mass, Breast Pain, Nipple Discharge and Skin Changes. Cardiovascular Not Present- Chest Pain, Difficulty Breathing Lying Down, Leg Cramps, Palpitations,  Rapid Heart Rate, Shortness of Breath and Swelling of Extremities. Gastrointestinal Present- Excessive gas. Not Present- Abdominal Pain, Bloating, Bloody Stool, Change in Bowel Habits, Chronic diarrhea, Constipation, Difficulty Swallowing, Gets full quickly at meals, Hemorrhoids, Indigestion, Nausea, Rectal Pain and Vomiting. Male Genitourinary Not Present- Blood in Urine, Change in Urinary Stream, Frequency, Impotence, Nocturia, Painful Urination, Urgency and Urine Leakage. Musculoskeletal Not Present- Back Pain, Joint Pain, Joint Stiffness, Muscle Pain, Muscle Weakness and Swelling of Extremities. Neurological Not Present- Decreased Memory, Fainting, Headaches, Numbness, Seizures, Tingling, Tremor, Trouble walking and Weakness. Psychiatric Present- Change in Sleep Pattern. Not Present- Anxiety, Bipolar, Depression, Fearful and Frequent crying. Endocrine Not Present- Cold Intolerance, Excessive Hunger, Hair Changes, Heat Intolerance, Hot flashes and New Diabetes. Hematology Not Present- Blood Thinners, Easy Bruising, Excessive bleeding, Gland problems, HIV and Persistent Infections.  Vitals Santiago Glad CMA; 06/14/2020 9:49 AM) 06/14/2020 9:49 AM Weight: 174.4 lb Height: 67.5in Body Surface Area: 1.92 m Body Mass Index: 26.91 kg/m  Temp.: 98.75F  Pulse: 101 (Regular)  BP: 122/82(Sitting, Left Arm, Standard)       Physical Exam Axel Filler MD; 06/14/2020 9:56 AM) The physical exam findings are as follows: Note: Constitutional: No acute distress, conversant, appears stated age  Eyes: Anicteric sclerae, moist conjunctiva, no lid lag  Neck: No thyromegaly, trachea midline, no cervical lymphadenopathy  Lungs: Clear to auscultation biilaterally, normal respiratory effot  Cardiovascular: regular rate & rhythm, no murmurs, no peripheal edema, pedal pulses 2+  GI: Soft, no masses or hepatosplenomegaly, non-tender to palpation, right abdominal drain in place.  MSK: Normal  gait, no clubbing cyanosis, edema  Skin: No rashes, palpation reveals normal skin turgor  Psychiatric: Appropriate judgment and insight, oriented to person, place, and time    Assessment & Plan Axel Filler MD; 06/14/2020 9:58 AM) PERFORATED APPENDICITIS (K35.32) Impression:  Patient is a 29 year old male with a history of perforated appendicitis with IR drain placement for abscess. Patient currently is doing well and is in favor of proceeding with interval appendectomy. 1. Will proceed to the operating room for lap appendectomy. 2. Discussed the risks and benefits of the procedure to include but limited to: Infection, bleeding, damage to surrounding structures, possible need for further surgery. Patient was understanding and wishes to proceed.

## 2020-07-02 ENCOUNTER — Other Ambulatory Visit: Payer: Self-pay

## 2020-07-08 MED FILL — NORMAL SALINE IV FLUSH SYR: 0.9 | 28 days supply | Qty: 280 | Fill #0

## 2020-08-01 ENCOUNTER — Other Ambulatory Visit (HOSPITAL_COMMUNITY)
Admission: RE | Admit: 2020-08-01 | Discharge: 2020-08-01 | Disposition: A | Discharge status: Home / Self Care | Payer: Self-pay | Source: Ambulatory Visit | Attending: General Surgery | Admitting: General Surgery

## 2020-08-01 DIAGNOSIS — Z20822 Contact with and (suspected) exposure to covid-19: Secondary | ICD-10-CM | POA: Insufficient documentation

## 2020-08-01 DIAGNOSIS — Z01812 Encounter for preprocedural laboratory examination: Secondary | ICD-10-CM | POA: Insufficient documentation

## 2020-08-01 LAB — SARS CORONAVIRUS 2 (TAT 6-24 HRS): SARS Coronavirus 2: NEGATIVE

## 2020-08-02 ENCOUNTER — Encounter (HOSPITAL_COMMUNITY): Payer: Self-pay | Admitting: General Surgery

## 2020-08-02 NOTE — Progress Notes (Addendum)
Jonathan Hampton denies chest pain or shortness of breath. Patient tested negative for Covid on 8/26 and has been in quarantine since that time. Jonathan Hampton Heart rate was 159 on EKG on 05/16/20, heart rate later in hospitalization, HR was in the low 100.  Pt was seen  was in Dr. Jacinto Halim office on 06/14/2020- Pulse rate was  101.  Jonathan Hampton states that  He does not have palpations or fell like his heart rate speeds up.

## 2020-08-02 NOTE — Progress Notes (Signed)
Anesthesia Chart Review: Jonathan Hampton   Case: [id number] Date/Time: 08/05/20 1015   Procedure: APPENDECTOMY LAPAROSCOPIC (N/A )   Anesthesia type: General   Pre-op diagnosis: PERFORATED APPENDICITIS   [facility] / [facility]   Surgeons: Axel Filler, MD      DISCUSSION: Patient is a Spanish-speaking 29 year old male scheduled for the above procedure.  History includes smoking and admission 05/26/20-05/20/20 for perforated appendicitis (s/p antibiotics, percutaneous drain per IR).   08/01/2020 presurgical COVID-19 test negative.  Anesthesia team to evaluate on the day of surgery.   VS: Ht 5\' 6"  (1.676 m)   Wt 83 kg   BMI 29.54 kg/m  BP Readings from Last 3 Encounters:  06/05/20 116/68  05/20/20 132/83   Pulse Readings from Last 3 Encounters:  06/05/20 86  05/20/20 99    PROVIDERS: Patient, No Pcp Per   LABS: For day of surgery. As of 05/20/20, H/H 11.4/33.6, PLT 271, Cr 0.77, glucose 105.    IMAGES: CXR 05/16/20: FINDINGS: Shallow lung inflation with bibasilar atelectasis. No focal airspace consolidation. No pulmonary edema or pleural effusion. Normal cardiomediastinal contours. No pneumothorax. IMPRESSION: Shallow lung inflation with bibasilar atelectasis.   EKG: 05/16/20 (done in ED in setting of perforated appendicitis, SIRS, AKI, leukocytosis) Sinus tachycardia at 158 bpm Junctional ST depression, probably normal Borderline ECG No old tracing to compare Confirmed by 07/16/20 (Dione Booze) on 05/17/2020 10:12:25 AM   CV: N/A   Past Medical History:  Diagnosis Date  . Medical history non-contributory   . Ruptured appendicitis 05/16/2020    Past Surgical History:  Procedure Laterality Date  . IR RADIOLOGIST EVAL & MGMT  06/05/2020    MEDICATIONS: No current facility-administered medications for this encounter.   06/07/2020 acetaminophen (TYLENOL) 325 MG tablet  . oxyCODONE (OXY IR/ROXICODONE) 5 MG immediate release tablet  . sodium chloride flush  (NS) 0.9 % SOLN    Marland Kitchen, PA-C Surgical Short Stay/Anesthesiology [facility] Phone [phone] [facility] Phone [phone] 08/02/2020 5:25 PM

## 2020-08-02 NOTE — Anesthesia Preprocedure Evaluation (Addendum)
Anesthesia Evaluation  Patient identified by MRN, date of birth, ID band Patient awake    Reviewed: Allergy & Precautions, NPO status , Patient's Chart, lab work & pertinent test results  Airway Mallampati: II  TM Distance: >3 FB Neck ROM: Full    Dental no notable dental hx.    Pulmonary neg pulmonary ROS, Current Smoker and Patient abstained from smoking.,    Pulmonary exam normal breath sounds clear to auscultation       Cardiovascular negative cardio ROS Normal cardiovascular exam Rhythm:Regular Rate:Normal     Neuro/Psych negative neurological ROS  negative psych ROS   GI/Hepatic negative GI ROS, Neg liver ROS,   Endo/Other  negative endocrine ROS  Renal/GU negative Renal ROS  negative genitourinary   Musculoskeletal negative musculoskeletal ROS (+)   Abdominal   Peds  Hematology negative hematology ROS (+)   Anesthesia Other Findings   Reproductive/Obstetrics                            Anesthesia Physical Anesthesia Plan  ASA: II  Anesthesia Plan: General   Post-op Pain Management:    Induction: Intravenous  PONV Risk Score and Plan: 1 and Midazolam, Dexamethasone and Ondansetron  Airway Management Planned: Oral ETT  Additional Equipment:   Intra-op Plan:   Post-operative Plan: Extubation in OR  Informed Consent: I have reviewed the patients History and Physical, chart, labs and discussed the procedure including the risks, benefits and alternatives for the proposed anesthesia with the patient or authorized representative who has indicated his/her understanding and acceptance.     Dental advisory given  Plan Discussed with: CRNA  Anesthesia Plan Comments: ( )       Anesthesia Quick Evaluation

## 2020-08-05 ENCOUNTER — Ambulatory Visit (HOSPITAL_COMMUNITY)
Admission: RE | Admit: 2020-08-05 | Discharge: 2020-08-05 | Disposition: A | Discharge status: Home / Self Care | Payer: Self-pay | Attending: General Surgery | Admitting: General Surgery

## 2020-08-05 ENCOUNTER — Encounter (HOSPITAL_COMMUNITY): Payer: Self-pay | Admitting: General Surgery

## 2020-08-05 ENCOUNTER — Other Ambulatory Visit: Payer: Self-pay

## 2020-08-05 ENCOUNTER — Encounter (HOSPITAL_COMMUNITY)
Admission: RE | Disposition: A | Discharge status: Home / Self Care | Payer: Self-pay | Source: Home / Self Care | Attending: General Surgery

## 2020-08-05 ENCOUNTER — Ambulatory Visit (HOSPITAL_COMMUNITY): Payer: Self-pay | Admitting: Anesthesiology

## 2020-08-05 DIAGNOSIS — K3532 Acute appendicitis with perforation and localized peritonitis, without abscess: Secondary | ICD-10-CM | POA: Insufficient documentation

## 2020-08-05 HISTORY — PX: LAPAROSCOPIC APPENDECTOMY: SHX408

## 2020-08-05 HISTORY — DX: Other specified health status: Z78.9

## 2020-08-05 LAB — CBC
HCT: 42.3 % (ref 39.0–52.0)
Hemoglobin: 13.4 g/dL (ref 13.0–17.0)
MCH: 27.4 pg (ref 26.0–34.0)
MCHC: 31.7 g/dL (ref 30.0–36.0)
MCV: 86.5 fL (ref 80.0–100.0)
Platelets: 249 10*3/uL (ref 150–400)
RBC: 4.89 MIL/uL (ref 4.22–5.81)
RDW: 13.3 % (ref 11.5–15.5)
WBC: 7.5 10*3/uL (ref 4.0–10.5)
nRBC: 0 % (ref 0.0–0.2)

## 2020-08-05 LAB — BASIC METABOLIC PANEL
Anion gap: 13 (ref 5–15)
BUN: 10 mg/dL (ref 6–20)
CO2: 22 mmol/L (ref 22–32)
Calcium: 9.4 mg/dL (ref 8.9–10.3)
Chloride: 102 mmol/L (ref 98–111)
Creatinine, Ser: 0.64 mg/dL (ref 0.61–1.24)
GFR calc Af Amer: >60 mL/min (ref >60)
GFR calc non Af Amer: >60 mL/min (ref >60)
Glucose, Bld: 97 mg/dL (ref 70–99)
Potassium: 3.6 mmol/L (ref 3.5–5.1)
Sodium: 137 mmol/L (ref 135–145)

## 2020-08-05 SURGERY — APPENDECTOMY, LAPAROSCOPIC
Anesthesia: General | Site: Abdomen

## 2020-08-05 MED ORDER — BUPIVACAINE HCL 0.25 % IJ SOLN
INTRAMUSCULAR | Status: DC | PRN
  Administered 2020-08-05: 4 mL

## 2020-08-05 MED ORDER — ROCURONIUM BROMIDE 10 MG/ML (PF) SYRINGE
PREFILLED_SYRINGE | INTRAVENOUS | Status: AC
  Filled 2020-08-05: qty 10

## 2020-08-05 MED ORDER — BUPIVACAINE HCL (PF) 0.25 % IJ SOLN
INTRAMUSCULAR | Status: AC
  Filled 2020-08-05: qty 30

## 2020-08-05 MED ORDER — FENTANYL CITRATE (PF) 100 MCG/2ML IJ SOLN
INTRAMUSCULAR | Status: AC
  Filled 2020-08-05: qty 2

## 2020-08-05 MED ORDER — LIDOCAINE 2% (20 MG/ML) 5 ML SYRINGE
INTRAMUSCULAR | Status: DC | PRN
  Administered 2020-08-05: 100 mg via INTRAVENOUS

## 2020-08-05 MED ORDER — LACTATED RINGERS IV SOLN: INTRAVENOUS | Status: DC

## 2020-08-05 MED ORDER — 0.9 % SODIUM CHLORIDE (POUR BTL) OPTIME
TOPICAL | Status: DC | PRN
  Administered 2020-08-05: 1000 mL

## 2020-08-05 MED ORDER — TRAMADOL HCL 50 MG PO TABS
ORAL_TABLET | ORAL | Status: AC
  Filled 2020-08-05: qty 1

## 2020-08-05 MED ORDER — PROPOFOL 10 MG/ML IV BOLUS
INTRAVENOUS | Status: DC | PRN
  Administered 2020-08-05: 200 mg via INTRAVENOUS

## 2020-08-05 MED ORDER — MIDAZOLAM HCL 5 MG/5ML IJ SOLN
INTRAMUSCULAR | Status: DC | PRN
  Administered 2020-08-05: 2 mg via INTRAVENOUS

## 2020-08-05 MED ORDER — SODIUM CHLORIDE 0.9 % IR SOLN
Status: DC | PRN
  Administered 2020-08-05: 1000 mL

## 2020-08-05 MED ORDER — ONDANSETRON HCL 4 MG/2ML IJ SOLN
INTRAMUSCULAR | Status: DC | PRN
  Administered 2020-08-05: 4 mg via INTRAVENOUS

## 2020-08-05 MED ORDER — TRAMADOL HCL 50 MG PO TABS
50.0000 mg | ORAL_TABLET | Freq: Once | ORAL | Status: AC
  Administered 2020-08-05: 50 mg via ORAL

## 2020-08-05 MED ORDER — CHLORHEXIDINE GLUCONATE CLOTH 2 % EX PADS: 6.0000 | MEDICATED_PAD | Freq: Once | CUTANEOUS | Status: DC

## 2020-08-05 MED ORDER — ROCURONIUM BROMIDE 10 MG/ML (PF) SYRINGE
PREFILLED_SYRINGE | INTRAVENOUS | Status: DC | PRN
  Administered 2020-08-05: 50 mg via INTRAVENOUS

## 2020-08-05 MED ORDER — CHLORHEXIDINE GLUCONATE 0.12 % MT SOLN
15.0000 mL | Freq: Once | OROMUCOSAL | Status: AC
  Administered 2020-08-05: 15 mL via OROMUCOSAL
  Filled 2020-08-05: qty 15

## 2020-08-05 MED ORDER — CEFAZOLIN SODIUM-DEXTROSE 2-4 GM/100ML-% IV SOLN
2.0000 g | INTRAVENOUS | Status: AC
  Administered 2020-08-05: 2 g via INTRAVENOUS
  Filled 2020-08-05: qty 100

## 2020-08-05 MED ORDER — FENTANYL CITRATE (PF) 250 MCG/5ML IJ SOLN
INTRAMUSCULAR | Status: AC
  Filled 2020-08-05: qty 5

## 2020-08-05 MED ORDER — ENSURE PRE-SURGERY PO LIQD: 296.0000 mL | Freq: Once | ORAL | Status: DC

## 2020-08-05 MED ORDER — MIDAZOLAM HCL 2 MG/2ML IJ SOLN
INTRAMUSCULAR | Status: AC
  Filled 2020-08-05: qty 2

## 2020-08-05 MED ORDER — CELECOXIB 200 MG PO CAPS
400.0000 mg | ORAL_CAPSULE | ORAL | Status: AC
  Administered 2020-08-05: 400 mg via ORAL
  Filled 2020-08-05: qty 2

## 2020-08-05 MED ORDER — LIDOCAINE 2% (20 MG/ML) 5 ML SYRINGE
INTRAMUSCULAR | Status: AC
  Filled 2020-08-05: qty 5

## 2020-08-05 MED ORDER — FENTANYL CITRATE (PF) 250 MCG/5ML IJ SOLN
INTRAMUSCULAR | Status: DC | PRN
Start: 2020-08-05 — End: 2020-08-05
  Administered 2020-08-05 (×2): 50 ug via INTRAVENOUS
  Administered 2020-08-05 (×2): 100 ug via INTRAVENOUS

## 2020-08-05 MED ORDER — TRAMADOL HCL 50 MG PO TABS: 50.0000 mg | ORAL_TABLET | Freq: Four times a day (QID) | ORAL | 0 refills | Status: AC | PRN

## 2020-08-05 MED ORDER — DEXAMETHASONE SODIUM PHOSPHATE 10 MG/ML IJ SOLN
INTRAMUSCULAR | Status: DC | PRN
  Administered 2020-08-05: 10 mg via INTRAVENOUS

## 2020-08-05 MED ORDER — FENTANYL CITRATE (PF) 100 MCG/2ML IJ SOLN
25.0000 ug | INTRAMUSCULAR | Status: DC | PRN
  Administered 2020-08-05 (×3): 50 ug via INTRAVENOUS

## 2020-08-05 MED ORDER — DEXAMETHASONE SODIUM PHOSPHATE 10 MG/ML IJ SOLN
INTRAMUSCULAR | Status: AC
  Filled 2020-08-05: qty 1

## 2020-08-05 MED ORDER — PROPOFOL 10 MG/ML IV BOLUS
INTRAVENOUS | Status: AC
  Filled 2020-08-05: qty 40

## 2020-08-05 MED ORDER — ONDANSETRON HCL 4 MG/2ML IJ SOLN
INTRAMUSCULAR | Status: AC
  Filled 2020-08-05: qty 2

## 2020-08-05 MED ORDER — PHENYLEPHRINE 40 MCG/ML (10ML) SYRINGE FOR IV PUSH (FOR BLOOD PRESSURE SUPPORT)
PREFILLED_SYRINGE | INTRAVENOUS | Status: AC
  Filled 2020-08-05: qty 10

## 2020-08-05 MED ORDER — ORAL CARE MOUTH RINSE: 15.0000 mL | Freq: Once | OROMUCOSAL | Status: AC

## 2020-08-05 MED ORDER — CEFAZOLIN SODIUM 1 G IJ SOLR
INTRAMUSCULAR | Status: AC
  Filled 2020-08-05: qty 20

## 2020-08-05 MED ORDER — ACETAMINOPHEN 500 MG PO TABS
1000.0000 mg | ORAL_TABLET | ORAL | Status: AC
  Administered 2020-08-05: 1000 mg via ORAL
  Filled 2020-08-05: qty 2

## 2020-08-05 MED FILL — traMADol HCL 50 MG TABS: 50 | 5 days supply | Qty: 20 | Fill #0

## 2020-08-05 SURGICAL SUPPLY — 43 items
APPLIER CLIP 5 13 M/L LIGAMAX5 (MISCELLANEOUS)
BLADE CLIPPER SURG (BLADE) ×3 IMPLANT
CANISTER SUCT 3000ML PPV (MISCELLANEOUS) ×3 IMPLANT
CHLORAPREP W/TINT 26 (MISCELLANEOUS) IMPLANT
CLIP APPLIE 5 13 M/L LIGAMAX5 (MISCELLANEOUS) IMPLANT
CLIP VESOLOCK XL 6/CT (CLIP) ×3 IMPLANT
COVER SURGICAL LIGHT HANDLE (MISCELLANEOUS) ×3 IMPLANT
COVER TRANSDUCER ULTRASND (DRAPES) ×3 IMPLANT
COVER WAND RF STERILE (DRAPES) IMPLANT
DERMABOND ADVANCED (GAUZE/BANDAGES/DRESSINGS) ×2
DERMABOND ADVANCED .7 DNX12 (GAUZE/BANDAGES/DRESSINGS) ×1 IMPLANT
DRSG TEGADERM 2-3/8X2-3/4 SM (GAUZE/BANDAGES/DRESSINGS) ×3 IMPLANT
ELECT REM PT RETURN 9FT ADLT (ELECTROSURGICAL) ×3
ELECTRODE REM PT RTRN 9FT ADLT (ELECTROSURGICAL) ×1 IMPLANT
ENDOLOOP SUT PDS II  0 18 (SUTURE) ×6
ENDOLOOP SUT PDS II 0 18 (SUTURE) ×3 IMPLANT
GAUZE SPONGE 2X2 8PLY STRL LF (GAUZE/BANDAGES/DRESSINGS) ×1 IMPLANT
GLOVE BIO SURGEON STRL SZ7.5 (GLOVE) ×3 IMPLANT
GOWN STRL REUS W/ TWL LRG LVL3 (GOWN DISPOSABLE) ×2 IMPLANT
GOWN STRL REUS W/ TWL XL LVL3 (GOWN DISPOSABLE) ×1 IMPLANT
GOWN STRL REUS W/TWL LRG LVL3 (GOWN DISPOSABLE) ×4
GOWN STRL REUS W/TWL XL LVL3 (GOWN DISPOSABLE) ×2
GRASPER SUT TROCAR 14GX15 (MISCELLANEOUS) ×3 IMPLANT
KIT BASIN OR (CUSTOM PROCEDURE TRAY) ×3 IMPLANT
KIT TURNOVER KIT B (KITS) ×3 IMPLANT
NEEDLE INSUFFLATION 14GA 120MM (NEEDLE) ×3 IMPLANT
NS IRRIG 1000ML POUR BTL (IV SOLUTION) ×3 IMPLANT
PAD ARMBOARD 7.5X6 YLW CONV (MISCELLANEOUS) ×6 IMPLANT
SCISSORS LAP 5X35 DISP (ENDOMECHANICALS) ×3 IMPLANT
SET IRRIG TUBING LAPAROSCOPIC (IRRIGATION / IRRIGATOR) ×3 IMPLANT
SET TUBE SMOKE EVAC HIGH FLOW (TUBING) ×3 IMPLANT
SLEEVE ENDOPATH XCEL 5M (ENDOMECHANICALS) ×3 IMPLANT
SPECIMEN JAR SMALL (MISCELLANEOUS) ×3 IMPLANT
SPONGE GAUZE 2X2 STER 10/PKG (GAUZE/BANDAGES/DRESSINGS) ×2
SUT MNCRL AB 4-0 PS2 18 (SUTURE) ×3 IMPLANT
TOWEL GREEN STERILE (TOWEL DISPOSABLE) ×3 IMPLANT
TOWEL GREEN STERILE FF (TOWEL DISPOSABLE) IMPLANT
TRAY FOLEY W/BAG SLVR 16FR (SET/KITS/TRAYS/PACK)
TRAY FOLEY W/BAG SLVR 16FR ST (SET/KITS/TRAYS/PACK) IMPLANT
TRAY LAPAROSCOPIC MC (CUSTOM PROCEDURE TRAY) ×3 IMPLANT
TROCAR XCEL NON-BLD 11X100MML (ENDOMECHANICALS) ×3 IMPLANT
TROCAR XCEL NON-BLD 5MMX100MML (ENDOMECHANICALS) ×3 IMPLANT
WATER STERILE IRR 1000ML POUR (IV SOLUTION) ×3 IMPLANT

## 2020-08-05 NOTE — Anesthesia Procedure Notes (Signed)
Procedure Name: Intubation Date/Time: 08/05/2020 9:10 AM Performed by: Adria Dill, CRNA Pre-anesthesia Checklist: Patient identified, Emergency Drugs available, Suction available and Patient being monitored Patient Re-evaluated:Patient Re-evaluated prior to induction Oxygen Delivery Method: Circle system utilized Preoxygenation: Pre-oxygenation with 100% oxygen Induction Type: IV induction Ventilation: Mask ventilation without difficulty Laryngoscope Size: Miller and 2 Grade View: Grade I Tube type: Oral Tube size: 7.5 mm Number of attempts: 1 Airway Equipment and Method: Stylet and Oral airway Placement Confirmation: ETT inserted through vocal cords under direct vision,  positive ETCO2 and breath sounds checked- equal and bilateral Secured at: 21 cm Tube secured with: Tape Dental Injury: Teeth and Oropharynx as per pre-operative assessment

## 2020-08-05 NOTE — Discharge Instructions (Signed)
PATIENT INSTRUCTIONS  APPENDICITIS    FOLLOW-UP:  Please make an appointment with your physician in 2 week(s).  Call your physician immediately if you have any fevers greater than 102.5, drainage from your wound that is not clear or looks infected, persistent bleeding, increasing abdominal pain, problems urinating, or persistent nausea/vomiting.      WOUND CARE INSTRUCTIONS:  Keep a dry clean dressing on the wound if there is drainage. The initial bandage may be removed after 24 hours.  Once the wound has quit draining you may leave it open to air.  If clothing rubs against the wound or causes irritation and the wound is not draining you may cover it with a dry dressing during the daytime.  Try to keep the wound dry and avoid ointments on the wound unless directed to do so.  If the wound becomes bright red and painful or starts to drain infected material that is not clear, please contact your physician immediately. You should also call immediately if you start to drain large amounts of fluid from the wound, requiring you to change the bandage frequently.   If the wound though is mildly pink and has a thick firm ridge underneath it, this is normal, and is referred to as a healing ridge.  This will resolve over the next 4-6 weeks.    DIET:  You may eat any foods that you can tolerate.  It is a good idea to eat a high fiber diet and take in plenty of fluids to prevent constipation.  If you do become constipated you may want to take a mild laxative or take ducolax tablets on a daily basis until your bowel habits are regular.  Constipation can be very uncomfortable, along with straining, after recent abdominal surgery.    ACTIVITY:  You are encouraged to cough and deep breath or use your incentive spirometer if you were given one, every 15-30 minutes when awake.  This will help prevent respiratory complications and low grade fevers post-operatively.  You may want to hug a pillow when coughing and sneezing to add  additional support to the surgical area which will decrease pain during these times.  You are encouraged to walk and engage in light activity for the next two weeks.  You should not lift more than 20 pounds during this time frame as it could put you at increased risk for a post-operative hernia.  Twenty pounds is roughly equivalent to a plastic bag of groceries.      MEDICATIONS:  Try to take narcotic medications and anti-inflammatory medications, such as tylenol, ibuprofen, naprosyn, etc., with food.  This will minimize stomach upset from the medication.  Should you develop nausea and vomiting from the pain medication, or develop a rash, please discontinue the medication and contact your physician.  You should not drive, make important decisions, or operate machinery when taking narcotic pain medication.    QUESTIONS:  Please feel free to call your physician or the hospital operator if you have any questions, and they will be glad to assist you.

## 2020-08-05 NOTE — Progress Notes (Signed)
50 mcg of Fentanyl wasted with Zeb Comfort, RN

## 2020-08-05 NOTE — Anesthesia Postprocedure Evaluation (Signed)
Anesthesia Post Note  Patient: Jonathan Hampton  Procedure(s) Performed: APPENDECTOMY LAPAROSCOPIC (N/A Abdomen)     Patient location during evaluation: PACU Anesthesia Type: General Level of consciousness: awake and alert Pain management: pain level controlled Vital Signs Assessment: post-procedure vital signs reviewed and stable Respiratory status: spontaneous breathing, nonlabored ventilation, respiratory function stable and patient connected to nasal cannula oxygen Cardiovascular status: blood pressure returned to baseline and stable Postop Assessment: no apparent nausea or vomiting Anesthetic complications: no   No complications documented.  Last Vitals:  Vitals:   08/05/20 1035 08/05/20 1050  BP: 132/80 131/78  Pulse: 80 72  Resp: 18 15  Temp:  36.6 C  SpO2: 95% 99%    Last Pain:  Vitals:   08/05/20 1050  PainSc: 2                  Young Mulvey L Sueanne Maniaci

## 2020-08-05 NOTE — Op Note (Signed)
08/05/2020  10:04 AM  PATIENT:  Jonathan Hampton  29 y.o. male  PRE-OPERATIVE DIAGNOSIS:  PERFORATED APPENDICITIS  POST-OPERATIVE DIAGNOSIS:  PERFORATED APPENDICITIS  PROCEDURE:  Procedure(s): APPENDECTOMY LAPAROSCOPIC (N/A)  SURGEON:  Surgeon(s) and Role:    * Axel Filler, MD - Primary  ANESTHESIA:   local and general  EBL:  minimal   BLOOD ADMINISTERED:none  DRAINS: none   LOCAL MEDICATIONS USED:  BUPIVICAINE   SPECIMEN:  Source of Specimen:  appendix  DISPOSITION OF SPECIMEN:  PATHOLOGY  COUNTS:  YES  TOURNIQUET:  * No tourniquets in log *  DICTATION: .Dragon Dictation Complications: none  Counts: reported as correct x 2  Findings:  The patient had a acutely inflamed appendix with a drain that was removed from intraabdominal space.  Drain was removed in its entirety and tip was intact.  Specimen: Appendix  Indications for procedure:  The patient is a 29 year old male with a history of periumbilical pain localized in the right lower quadrant patient had a CT scan which revealed signs consistent with acute perforated appendicitis.  Pt had a IR drain placed and the pt was placed on abx and  Sent home for interval appendectomy.  Details of the procedure:The patient was taken back to the operating room. The patient was placed in supine position with bilateral SCDs in place. The patient was prepped and draped in the usual sterile fashion.  After appropriate anitbiotics were confirmed, a time-out was confirmed and all facts were verified.    A pneumoperitoneum of 14 mmHg was obtained via a Veress needle technique in the left lower quadrant quadrant.  A 5 mm trocar and 5 mm camera then placed intra-abdominally there is no injury to any intra-abdominal organs a 10 mm infraumbilical port was placed and direct visualization as was a 5 mm port in the suprapubic area.   The appendix was identified and seen to be non perforated and very inflamed and adherent to the  lateral abdominal wall.  The appendix was cleaned down to the appendiceal base. The mesoappendix was then incised and the appendiceal artery was cauterized.  The the appendiceal base was clean.  A 0 PDS endoloop was placed proximallyx2 and one distally and the appendix was transected between these 2. A retrieval bag was then placed into the abdomen and the specimen placed in the bag. The appendiceal stump was cauterized. We evacuate the fluid from the pelvis until the effluent was clear.  The appendix and retrieval  bag was then retrieved via the supraumbilical port. #1 Vicryl was used to reapproximate the fascia at the umbilical port site x2. The skin was reapproximated all port sites 3-0 Monocryl subcuticular fashion. The skin was dressed with Dermabond.  The patient had the foley removed. The patient was awakened from general anesthesia was taken to recovery room in stable condition.      PLAN OF CARE: Discharge to home after PACU  PATIENT DISPOSITION:  PACU - hemodynamically stable.   Delay start of Pharmacological VTE agent (>24hrs) due to surgical blood loss or risk of bleeding: not applicable

## 2020-08-05 NOTE — Transfer of Care (Signed)
Immediate Anesthesia Transfer of Care Note  Patient: Jonathan Hampton  Procedure(s) Performed: APPENDECTOMY LAPAROSCOPIC (N/A Abdomen)  Patient Location: PACU  Anesthesia Type:General  Level of Consciousness: awake and patient cooperative  Airway & Oxygen Therapy: Patient Spontanous Breathing and Patient connected to nasal cannula oxygen  Post-op Assessment: Report given to RN and Post -op Vital signs reviewed and stable  Post vital signs: Reviewed and stable  Last Vitals:  Vitals Value Taken Time  BP 140/87 08/05/20 1020  Temp 36.7 C 08/05/20 1020  Pulse 84 08/05/20 1026  Resp 19 08/05/20 1026  SpO2 100 % 08/05/20 1026  Vitals shown include unvalidated device data.  Last Pain:  Vitals:   08/05/20 1020  PainSc: 5          Complications: No complications documented.

## 2020-08-05 NOTE — H&P (Signed)
History of Present Illness  The patient is a 29 year old male who presents with a complaint of perforated appendicitis. Patient is a 29 year old male who was recently discharged from the hospital after being admitted for perforated appendicitis. Patient had an IR drain placed. Patient continue with antibiotics while in the hospital as well as after discharge. Patient had a follow-up CT scan Wednesday which showed minimal inflammation and no abscess cavity.  Patient states that he is fishes antibodies, has no pain comes had no fevers, is eating normally and has had normal bowel function.     Past Surgical History  No pertinent past surgical history   Diagnostic Studies History  Colonoscopy  never  Allergies  No Known Drug Allergies  [06/14/2020]: Allergies Reconciled   Medication History No Current Medications Medications Reconciled  Other Problems  No pertinent past medical history     Review of Systems General Not Present- Appetite Loss, Chills, Fatigue, Fever, Night Sweats, Weight Gain and Weight Loss. Skin Not Present- Change in Wart/Mole, Dryness, Hives, Jaundice, New Lesions, Non-Healing Wounds, Rash and Ulcer. HEENT Not Present- Earache, Hearing Loss, Hoarseness, Nose Bleed, Oral Ulcers, Ringing in the Ears, Seasonal Allergies, Sinus Pain, Sore Throat, Visual Disturbances, Wears glasses/contact lenses and Yellow Eyes. Respiratory Not Present- Bloody sputum, Chronic Cough, Difficulty Breathing, Snoring and Wheezing. Breast Not Present- Breast Mass, Breast Pain, Nipple Discharge and Skin Changes. Cardiovascular Not Present- Chest Pain, Difficulty Breathing Lying Down, Leg Cramps, Palpitations, Rapid Heart Rate, Shortness of Breath and Swelling of Extremities. Gastrointestinal Present- Excessive gas. Not Present- Abdominal Pain, Bloating, Bloody Stool, Change in Bowel Habits, Chronic diarrhea, Constipation, Difficulty Swallowing, Gets full quickly at  meals, Hemorrhoids, Indigestion, Nausea, Rectal Pain and Vomiting. Male Genitourinary Not Present- Blood in Urine, Change in Urinary Stream, Frequency, Impotence, Nocturia, Painful Urination, Urgency and Urine Leakage. Musculoskeletal Not Present- Back Pain, Joint Pain, Joint Stiffness, Muscle Pain, Muscle Weakness and Swelling of Extremities. Neurological Not Present- Decreased Memory, Fainting, Headaches, Numbness, Seizures, Tingling, Tremor, Trouble walking and Weakness. Psychiatric Present- Change in Sleep Pattern. Not Present- Anxiety, Bipolar, Depression, Fearful and Frequent crying. Endocrine Not Present- Cold Intolerance, Excessive Hunger, Hair Changes, Heat Intolerance, Hot flashes and New Diabetes. Hematology Not Present- Blood Thinners, Easy Bruising, Excessive bleeding, Gland problems, HIV and Persistent Infections.  BP 134/73   Pulse 76   Temp 98.4 F (36.9 C)   Resp 18   Ht 5\' 6"  (1.676 m)   Wt 83 kg   SpO2 100%   BMI 29.53 kg/m       Physical Exam  The physical exam findings are as follows: Note: Constitutional: No acute distress, conversant, appears stated age  Eyes: Anicteric sclerae, moist conjunctiva, no lid lag  Neck: No thyromegaly, trachea midline, no cervical lymphadenopathy  Lungs: Clear to auscultation biilaterally, normal respiratory effot  Cardiovascular: regular rate & rhythm, no murmurs, no peripheal edema, pedal pulses 2+  GI: Soft, no masses or hepatosplenomegaly, non-tender to palpation, right abdominal drain in place.  MSK: Normal gait, no clubbing cyanosis, edema  Skin: No rashes, palpation reveals normal skin turgor  Psychiatric: Appropriate judgment and insight, oriented to person, place, and time    Assessment & Plan PERFORATED APPENDICITIS (K35.32) Impression: Patient is a 29 year old male with a history of perforated appendicitis with IR drain placement for abscess. Patient currently is doing well and is in favor  of proceeding with interval appendectomy. 1. Will proceed to the operating room for lap appendectomy. 2. Discussed the risks and benefits of  the procedure to include but limited to: Infection, bleeding, damage to surrounding structures, possible need for further surgery. Patient was understanding and wishes to proceed.

## 2020-08-06 ENCOUNTER — Encounter (HOSPITAL_COMMUNITY): Payer: Self-pay | Admitting: General Surgery

## 2020-08-06 LAB — SURGICAL PATHOLOGY

## 2021-08-24 IMAGING — CT CT ABD-PELV W/ CM
2 of 4 series · 14 of 46 positions shown, 16 images · IV contrast (iopamidol)
Comparison: 05/16/2020, drainage performed 05/17/2020 (no images
available at this time

CLINICAL DATA: 29-year-old male with a history of acute perforated
appendicitis

EXAM:
CT ABDOMEN AND PELVIS WITH CONTRAST
TECHNIQUE: Multidetector CT imaging of the abdomen and pelvis was performed
using the standard protocol following bolus administration of
intravenous contrast.
CONTRAST:  100mL [id number] IOPAMIDOL ([id number]) INJECTION 61%

[Series 2: abd pelvis 5.00 br40 s3 axial · axial · 0.72mm/px · z∈[+1282,+1722]mm · 11 of 104 slices shown, 13 images]
[im 8/104  soft-tissue]
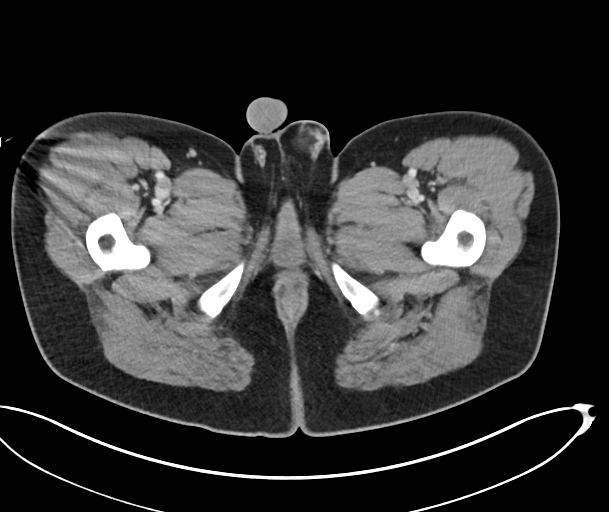
[im 8/104  bone]
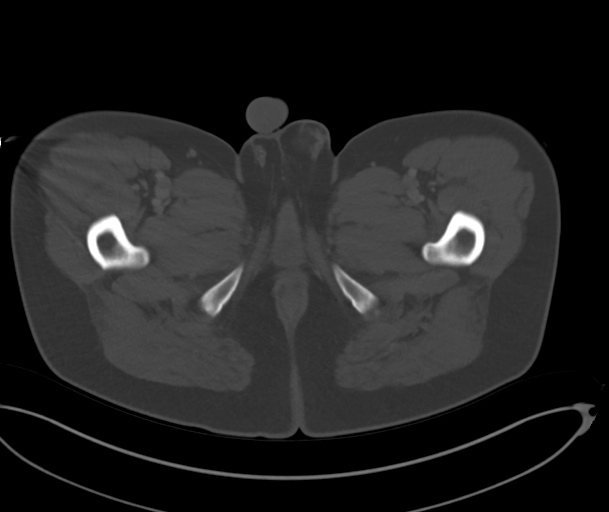
[im 16/104  soft-tissue]
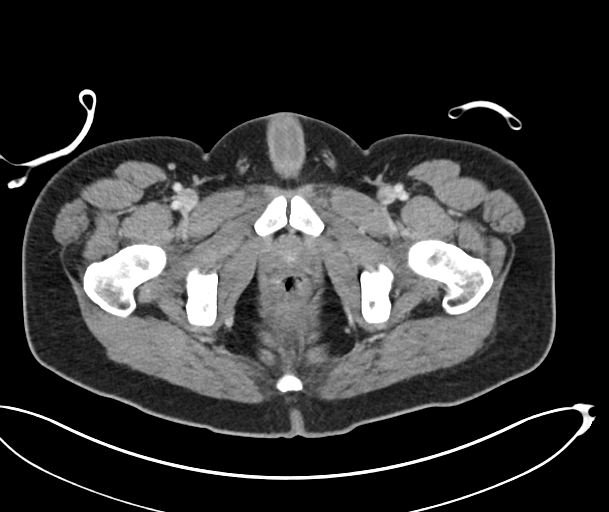
[im 24/104  soft-tissue]
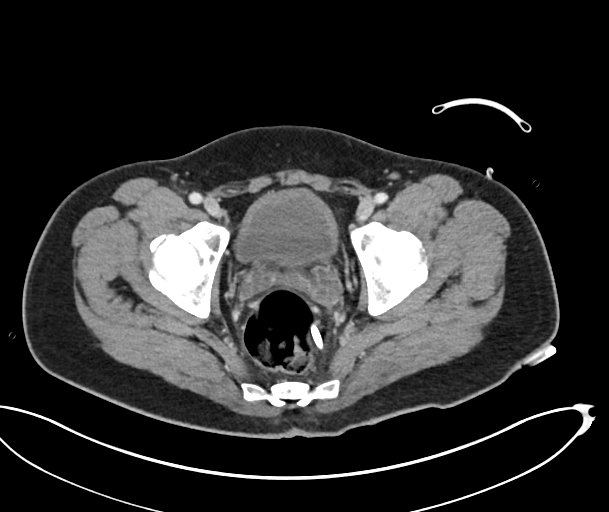
[im 32/104  soft-tissue]
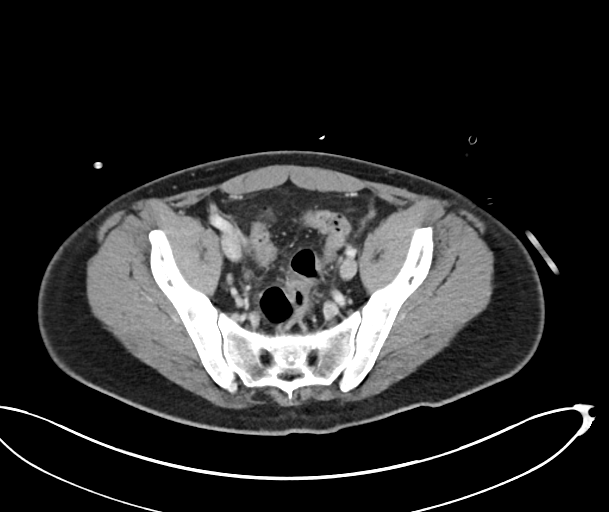
[im 44/104  soft-tissue]
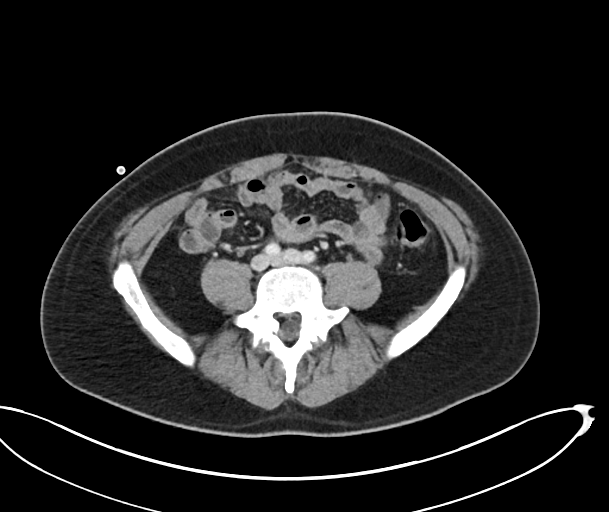
[im 52/104  soft-tissue]
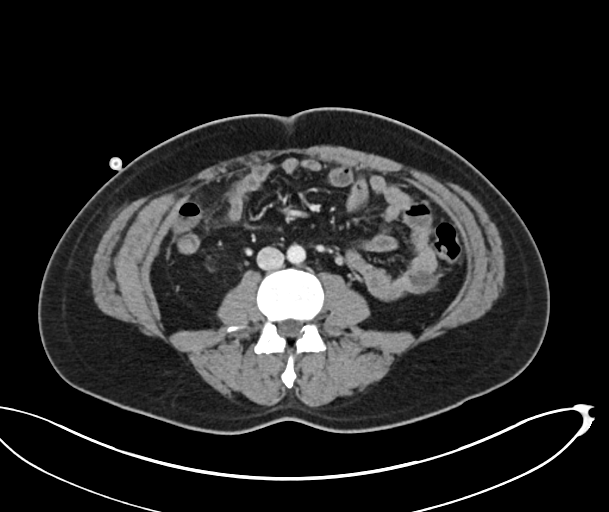
[im 60/104  soft-tissue]
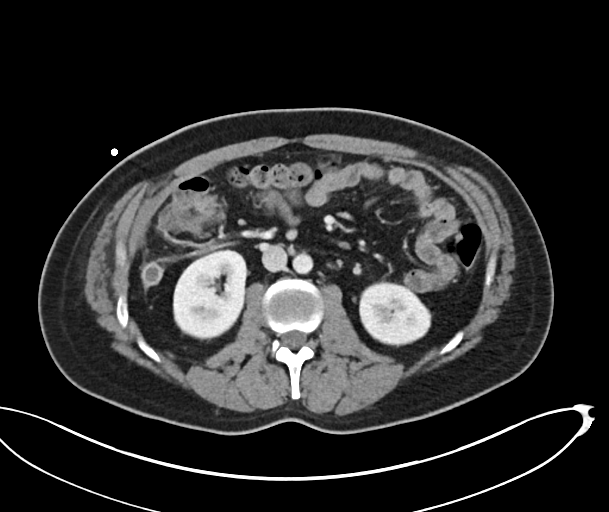
[im 72/104  soft-tissue]
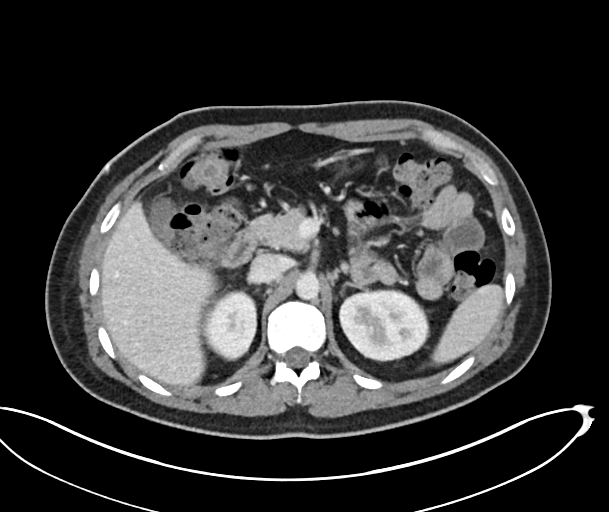
[im 80/104  soft-tissue]
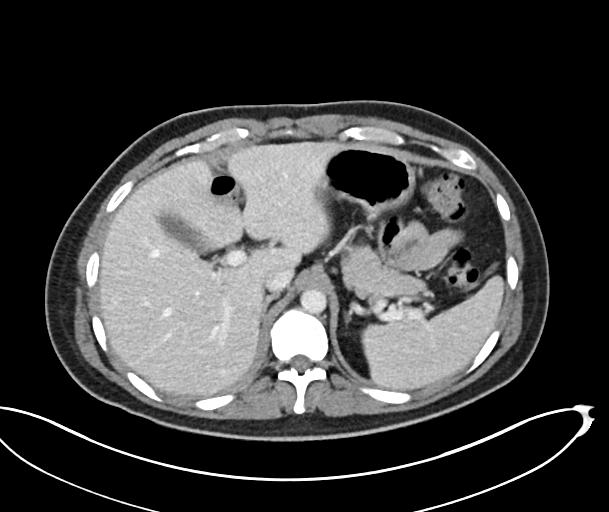
[im 80/104  bone]
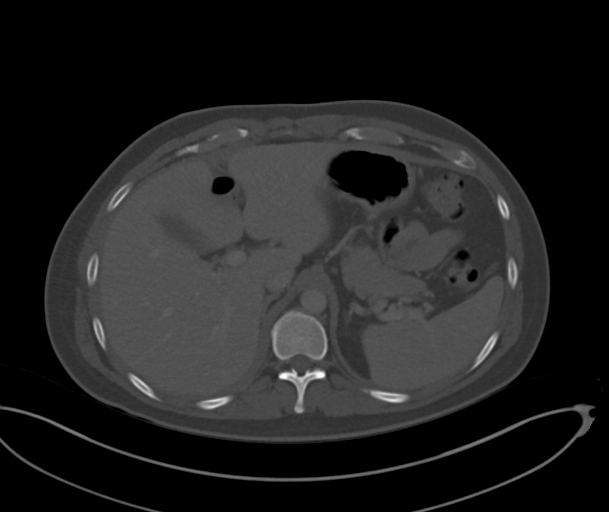
[im 88/104  soft-tissue]
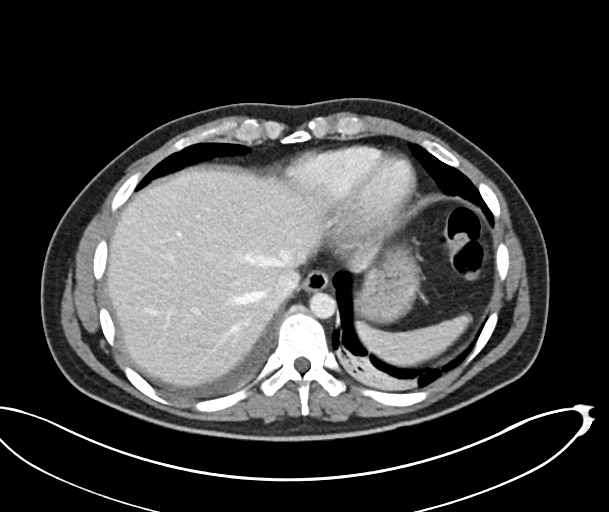
[im 96/104  soft-tissue]
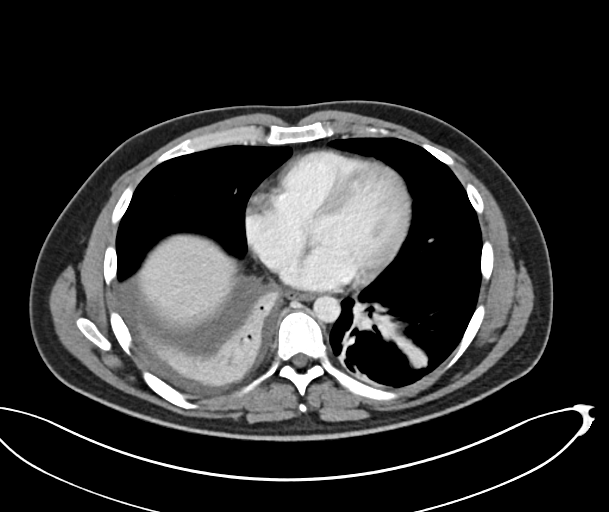

[Series 6: abd pelvis 2.00 br40 s3 cor · coronal · 0.85mm/px · 3 of 184 slices shown]
[im 62/184  soft-tissue]
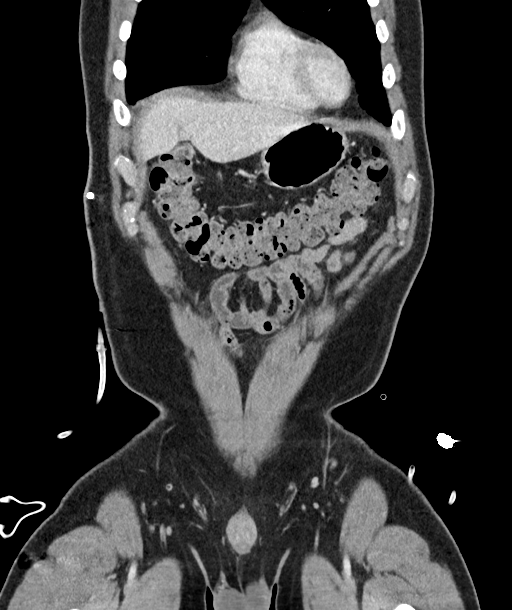
[im 82/184  soft-tissue]
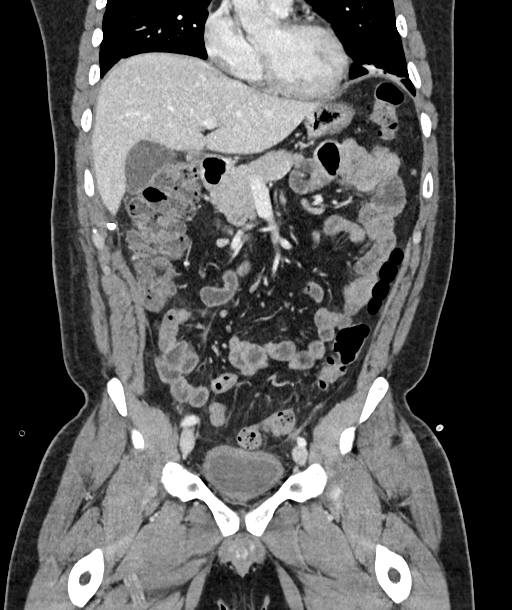
[im 102/184  soft-tissue]
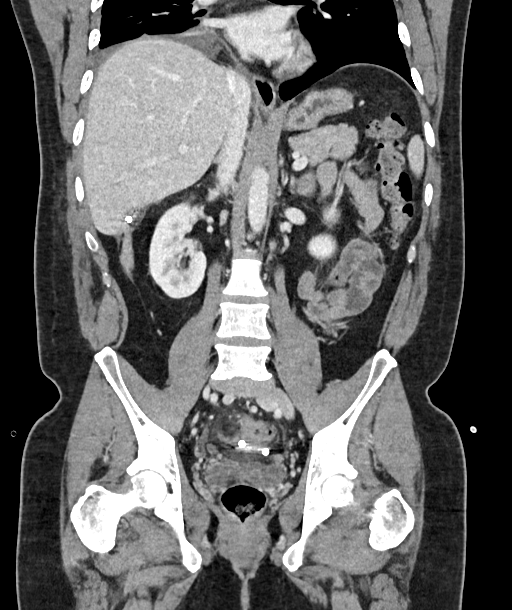

[14 of 46 positions shown; findings below may reference images not displayed]

FINDINGS: Lower chest: Small right pleural effusion with associated
atelectasis of the right lower lobe. Atelectasis scarring at the
left lung base.

Hepatobiliary: Unremarkable liver.  Gallbladder unremarkable.

Pancreas: Unremarkable

Spleen: Unremarkable

Adrenals/Urinary Tract:

- Right adrenal gland:  Unremarkable

- Left adrenal gland: Unremarkable.

- Right kidney: No hydronephrosis, nephrolithiasis, inflammation, or
ureteral dilation. No focal lesion.

- Left Kidney: No hydronephrosis, nephrolithiasis, inflammation, or
ureteral dilation. No focal lesion.

- Urinary Bladder: Unremarkable.

Stomach/Bowel:

- Stomach: Unremarkable.

- Small bowel: Unremarkable

- Appendix: Appendix is retrocecal, extending cephalad. There is
fluid retained within the appendix, with decompression of the tip of
the appendix compared to prior. Mucosa appears hyperenhancing on the
current CT.

There has been complete decompression of the gas and fluid
collection extending cephalad adjacent to the liver, with pigtail
drainage catheter terminating in the subhepatic space with no
residual. Questionable appendicolith persists at the base of the
appendix.

- Colon: Unremarkable.

Vascular/Lymphatic: Unremarkable arterial and venous vasculature.
Unremarkable portal vasculature.

Reproductive: Unremarkable

Other: Left transgluteal pigtail catheter terminates in the pelvis.
No residual fluid, with no significant inflammatory changes.

Musculoskeletal: Bilateral L5 pars defect with grade 1
anterolisthesis.
IMPRESSION: Right upper quadrant pigtail drainage catheter and left transgluteal
drainage catheter remain in position with no residual fluid at
either site.

Appendix demonstrates persistent fluid within the lumen with
questionable persisting appendicolith at the base.

## 2021-08-24 IMAGING — RF DG SINUS / FISTULA TRACT / ABSCESSOGRAM
1 series · 3 of 3 positions shown · non-contrast
Comparison: none

INDICATION: 29-year-old male with a history of ruptured appendicitis, with right
upper quadrant drain in place presenting for drain injection

[Series 1: one shot · 3 of 3 slices shown]
[im 1/3]
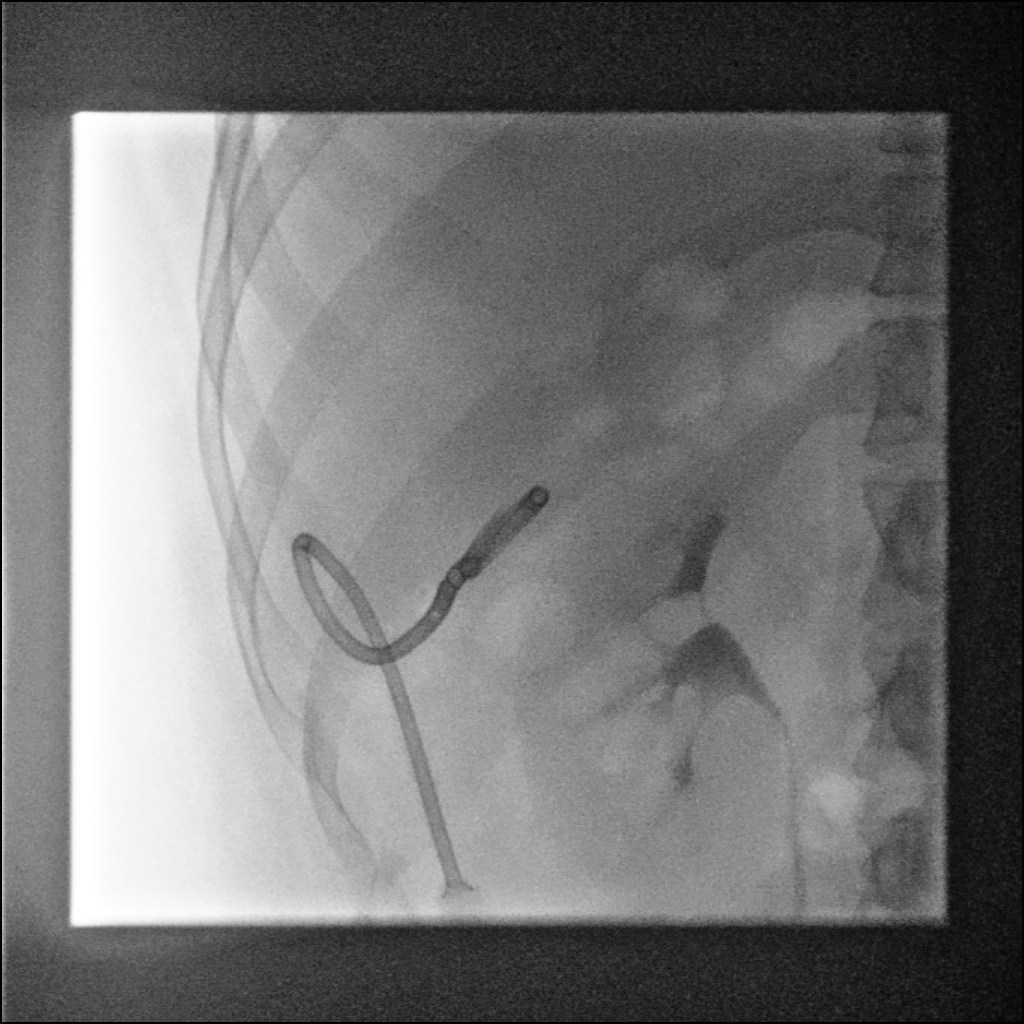
[im 2/3]
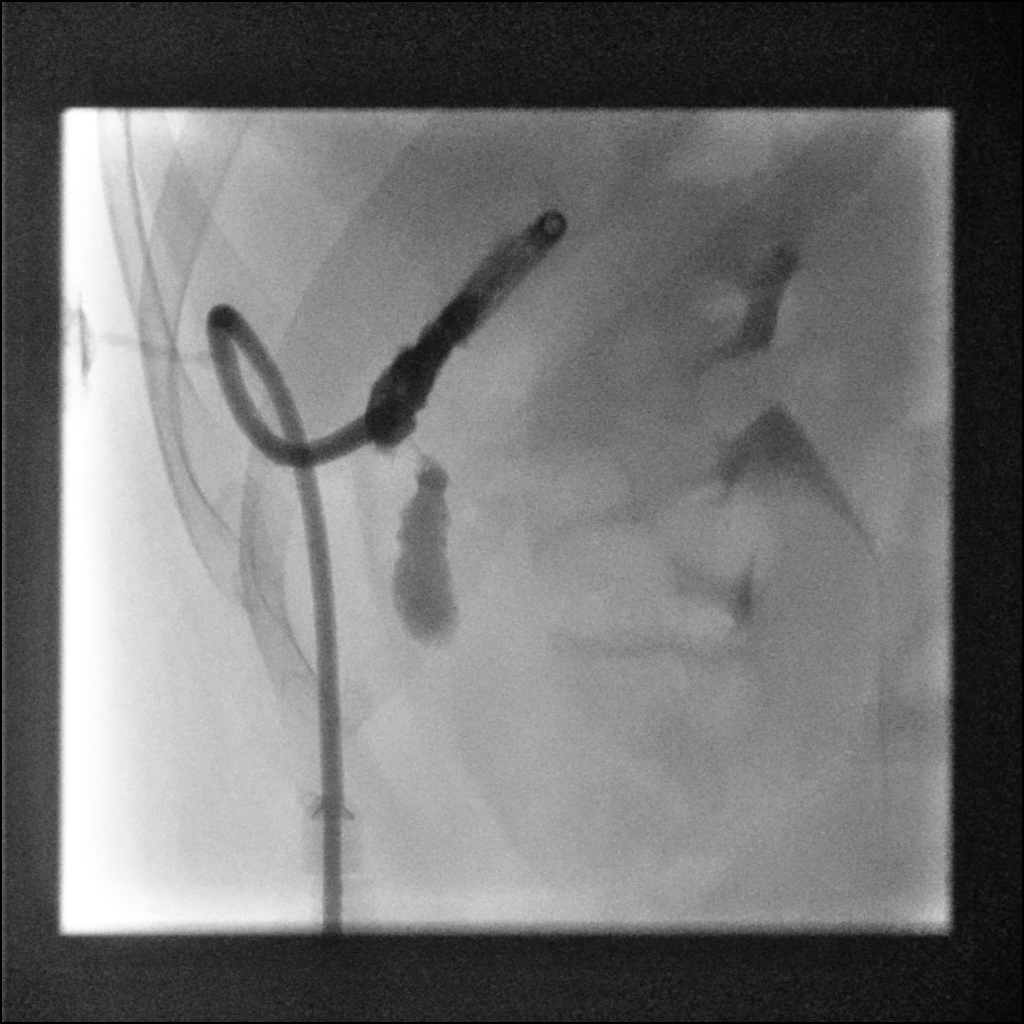
[im 3/3]
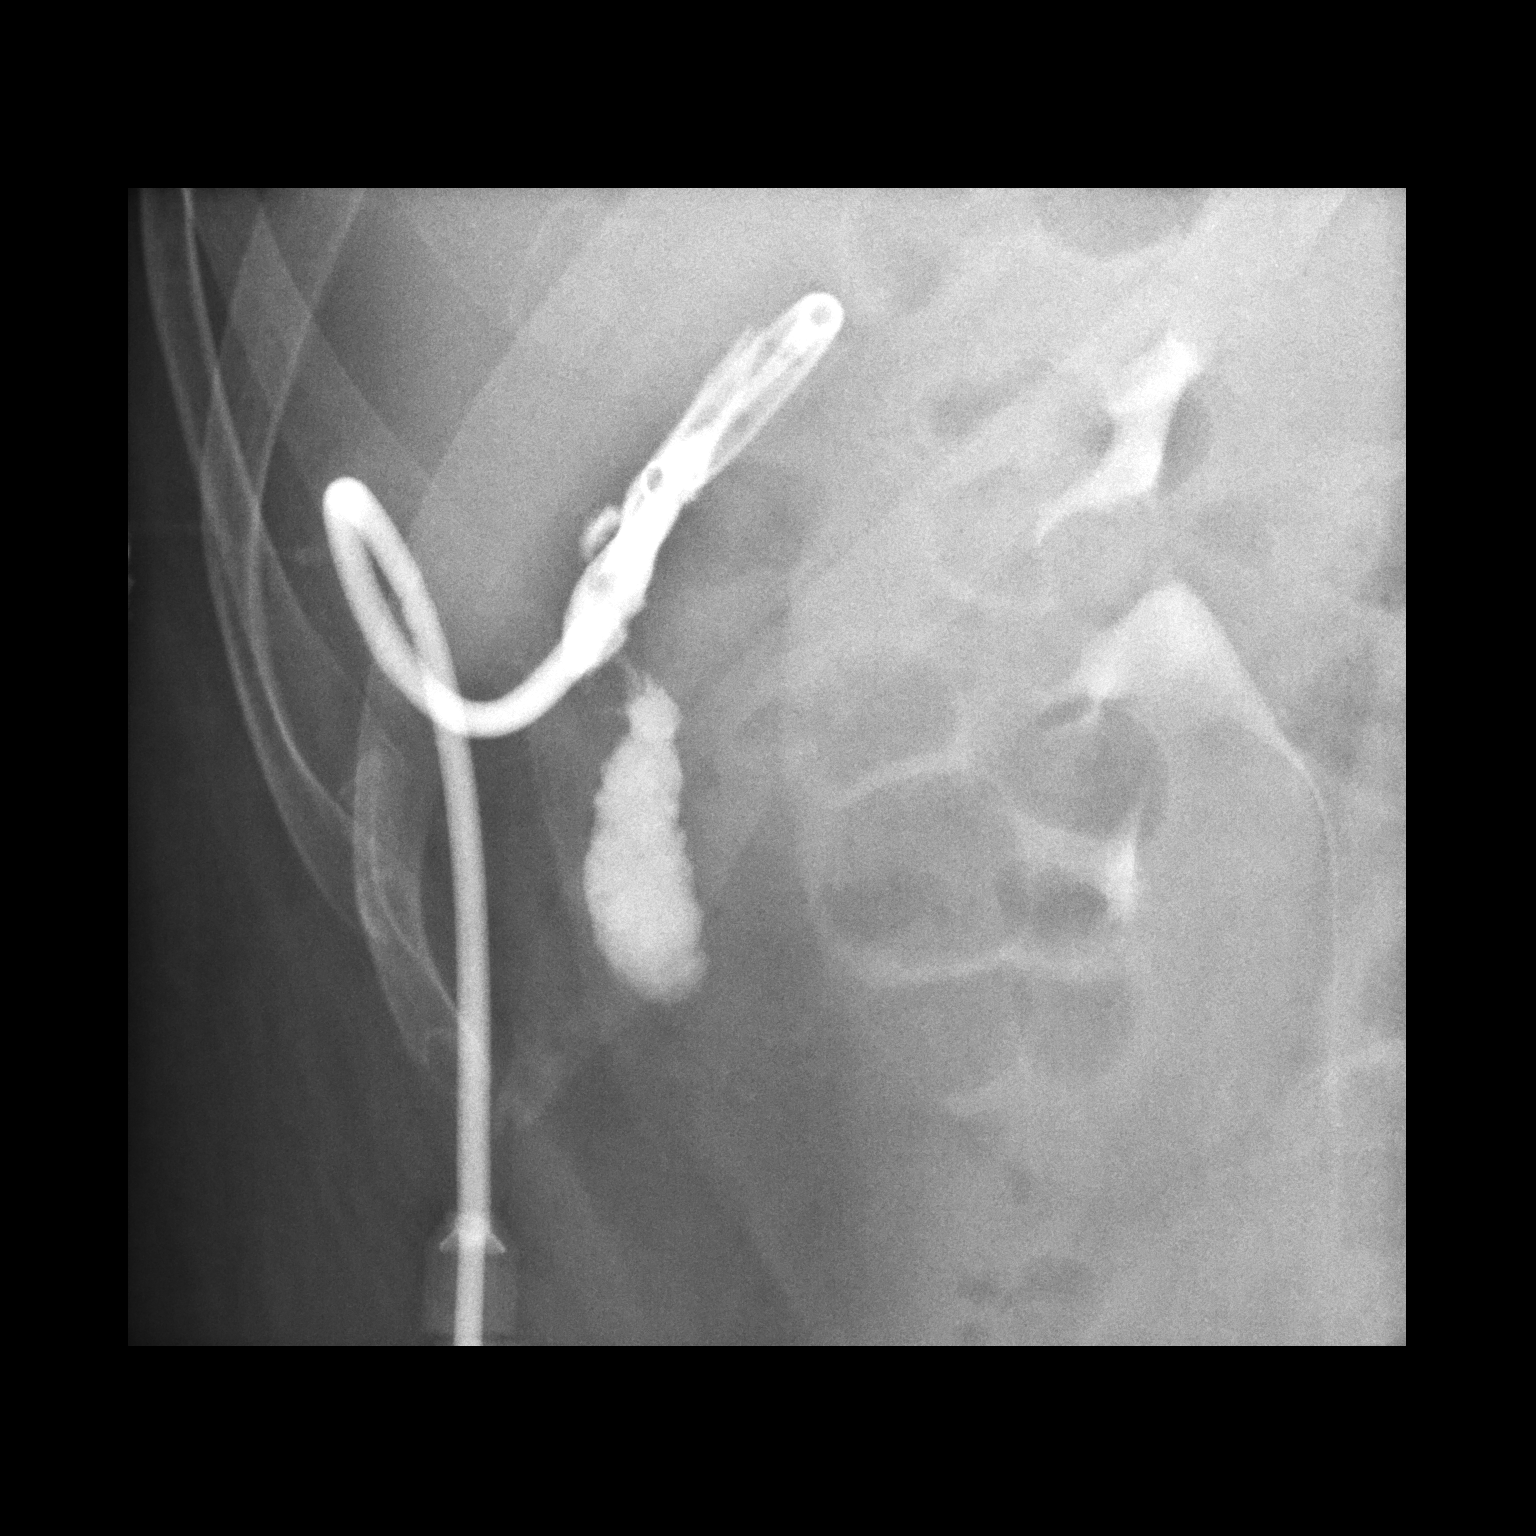

[3 of 3 positions shown; findings below may reference images not displayed]

EXAM:
IMAGE GUIDED RIGHT UPPER QUADRANT DRAIN INJECTION

MEDICATIONS:
None

ANESTHESIA/SEDATION:
None

COMPLICATIONS:
None

PROCEDURE:
Clean barrier Technique was utilized including caps, mask, hygiene
and skin antiseptic. A timeout was performed prior to the initiation
of the procedure.

Scout images were acquired. Gentle contrast injection was performed
at the right upper quadrant drain. Images were stored.

The drain catheter was attached to gravity bag.
FINDINGS: Drain injection of right upper quadrant percutaneous pigtail drain
catheter confirms absence of residual abscess, with a thin fistula
to the tip of the appendix.
IMPRESSION: Drain injection confirms small fistula to the tip of the appendix.

## 2024-04-15 ENCOUNTER — Ambulatory Visit (HOSPITAL_COMMUNITY)
Admission: EM | Admit: 2024-04-15 | Discharge: 2024-04-15 | Disposition: A | Discharge status: Home / Self Care | Payer: Self-pay | Attending: Emergency Medicine | Admitting: Emergency Medicine

## 2024-04-15 ENCOUNTER — Encounter (HOSPITAL_COMMUNITY): Payer: Self-pay | Admitting: *Deleted

## 2024-04-15 DIAGNOSIS — G510 Bell's palsy: Secondary | ICD-10-CM

## 2024-04-15 MED ORDER — VALACYCLOVIR HCL 1 G PO TABS: 1000.0000 mg | ORAL_TABLET | Freq: Three times a day (TID) | ORAL | 0 refills | Status: DC

## 2024-04-15 MED ORDER — PREDNISONE 20 MG PO TABS: 60.0000 mg | ORAL_TABLET | Freq: Every day | ORAL | 0 refills | Status: DC

## 2024-04-15 MED ORDER — HYPROMELLOSE (GONIOSCOPIC) 2.5 % OP SOLN: 1.0000 [drp] | Freq: Four times a day (QID) | OPHTHALMIC | 0 refills | Status: DC | PRN

## 2024-04-15 NOTE — ED Triage Notes (Signed)
 Via [employer] video interpreter [phone]: Pt started with left sided facial droop and inability to raise left eyebrow 2 days ago. C/O tearing to left eye and some numbness to left face. Pt ambulates without difficulty. Denies any extremity weakness or difficulty with speech

## 2024-04-15 NOTE — Discharge Instructions (Addendum)
 Su examen es compatible con parlisis de Bell, que causa parlisis facial debido a la inflamacin del nervio facial. Esto podra haber sido provocado por su herpes labial. Tome los esteroides diariamente con el desayuno. Use Valtrex 3 veces al da durante la siguiente semana. Puede usar lgrimas artificiales cada 4 horas para ayudar a Counselling psychologist.  Con tratamiento, la [employer] se recuperan completamente a los 6 meses. Si los sntomas persisten, consulte con su mdico de cabecera para una evaluacin adicional. Busque atencin mdica inmediata ante cualquier sntoma nuevo o preocupante.  Your examination is consistent with Bell's palsy, this causes facial paralysis due to inflammation of the facial nerve.  This may have been triggered by your cold sore.  Take the steroids daily with breakfast.  Use the Valtrex 3 times daily for the next week.  You can use the artificial tears every 4 hours to help provide lubrication to the eye.  With treatment, most patients fully recover by 6 months.  If your symptoms persist please follow-up with the primary care provider for further evaluation.  Seek immediate care for any new or concerning symptoms.

## 2024-04-15 NOTE — ED Provider Notes (Signed)
 [facility]    CSN: [id number] Arrival date & time: 04/15/24  1318      History   Chief Complaint Chief Complaint  Patient presents with   Appt 1300   Facial Droop    (X2 days)    HPI [facility] de Darlina Einstein is a 33 y.o. male.   Medical Spanish interpretor used for this encounter.  Around two days ago left side of face, left eye, forehead and cheek started to droop Noticed when he was brushing his teeth yesterday morning  Left eye has felt dry, he is unable to fully close it without difficulty  Before symptoms started he did have an outbreak of a cold sore.  Usually they are on his lips, but this one was inside of his mouth.  Medical hx of perforated appendix.  Denies other medical history.  Has not had any weakness, blurry vision or slurred speech.   The history is provided by the patient and medical records. The history is limited by a language barrier. A language interpreter was used.    Past Medical History:  Diagnosis Date   Ruptured appendicitis 05/16/2020    Patient Active Problem List   Diagnosis Date Noted   Perforated appendicitis 05/16/2020    Past Surgical History:  Procedure Laterality Date   IR RADIOLOGIST EVAL & MGMT  06/05/2020   LAPAROSCOPIC APPENDECTOMY N/A 08/05/2020   Procedure: APPENDECTOMY LAPAROSCOPIC;  Surgeon: Shela Derby, MD;  Location: [facility] OR;  Service: General;  Laterality: N/A;       Home Medications    Prior to Admission medications   Medication Sig Start Date End Date Taking? Authorizing Provider  hydroxypropyl methylcellulose / hypromellose (ISOPTO TEARS / GONIOVISC) 2.5 % ophthalmic solution Place 1 drop into the left eye 4 (four) times daily as needed for dry eyes. 04/15/24  Yes Nicklas Mcsweeney  N, FNP  predniSONE (DELTASONE) 20 MG tablet Take 3 tablets (60 mg total) by mouth daily for 7 days. 04/15/24 04/22/24 Yes Natelie Ostrosky  N, FNP  valACYclovir (VALTREX) 1000 MG tablet Take 1 tablet (1,000 mg total)  by mouth 3 (three) times daily for 7 days. 04/15/24 04/22/24 Yes Harlow Lighter, Tala Eber  N, FNP    Family History History reviewed. No pertinent family history.  Social History Social History   Tobacco Use   Smoking status: Some Days    Types: Cigarettes   Smokeless tobacco: Never   Tobacco comments:    "not much"  Vaping Use   Vaping status: Never Used  Substance Use Topics   Alcohol use: Not Currently    Comment: occasionally   Drug use: Not Currently    Comment: pt denies     Allergies   Patient has no known allergies.   Review of Systems Review of Systems  Per HPI  Physical Exam Triage Vital Signs ED Triage Vitals  Encounter Vitals Group     BP 04/15/24 1336 133/80     Systolic BP Percentile --      Diastolic BP Percentile --      Pulse Rate 04/15/24 1336 90     Resp 04/15/24 1336 16     Temp 04/15/24 1336 98.7 F (37.1 C)     Temp Source 04/15/24 1336 Oral     SpO2 04/15/24 1336 97 %     Weight --      Height --      Head Circumference --      Peak Flow --      Pain Score  04/15/24 1338 0     Pain Loc --      Pain Education --      Exclude from Growth Chart --    No data found.  Updated Vital Signs BP 133/80   Pulse 90   Temp 98.7 F (37.1 C) (Oral)   Resp 16   SpO2 97%   Visual Acuity Right Eye Distance:   Left Eye Distance:   Bilateral Distance:    Right Eye Near:   Left Eye Near:    Bilateral Near:     Physical Exam Vitals and nursing note reviewed.  Constitutional:      Appearance: Normal appearance.  HENT:     Head: Normocephalic and atraumatic.     Right Ear: External ear normal.     Left Ear: External ear normal.     Mouth/Throat:     Mouth: Mucous membranes are moist.  Eyes:     Conjunctiva/sclera: Conjunctivae normal.     Pupils: Pupils are equal, round, and reactive to light.  Cardiovascular:     Rate and Rhythm: Normal rate.  Pulmonary:     Effort: Pulmonary effort is normal. No respiratory distress.  Skin:     General: Skin is warm and dry.  Neurological:     Mental Status: He is alert.     GCS: GCS eye subscore is 4. GCS verbal subscore is 5. GCS motor subscore is 6.     Cranial Nerves: Cranial nerve deficit and facial asymmetry present. No dysarthria.     Sensory: Sensation is intact.     Motor: Motor function is intact. No weakness or tremor.     Gait: Gait is intact.     Comments: Patient with left-sided eyebrow sagging, complete paralysis of the forehead and mouth.  Flattening of nasolabial fold.  Mild drooping.  Upper and lower face impacted, suspicious for facial nerve palsy.     Psychiatric:        Mood and Affect: Mood normal.      UC Treatments / Results  Labs (all labs ordered are listed, but only abnormal results are displayed) Labs Reviewed - No data to display  EKG   Radiology No results found.  Procedures Procedures (including critical care time)  Medications Ordered in UC Medications - No data to display  Initial Impression / Assessment and Plan / UC Course  I have reviewed the triage vital signs and the nursing notes.  Pertinent labs & imaging results that were available during my care of the patient were reviewed by me and considered in my medical decision making (see chart for details).  Vitals in triage reviewed, patient is hemodynamically stable.  PERRLA.  Appears to have facial nerve deficits with a left-sided eyebrow sagging, unable to move forehead, eyebrow or mouth.  Flattening of nasolabial fold.  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  Will start on high-dose prednisone and Valtrex.  Artificial tears as needed.  Lower concern for CVA.  Motor function intact.  Strength 5 out of 5 in upper and lower extremities, gait intact.  Without slurred speech.  No vision changes.  Plan of care, follow-up care return precautions given, no questions at this time.     Final Clinical Impressions(s) / UC Diagnoses   Final diagnoses:   Bell's palsy     Discharge Instructions      Su examen es compatible con parlisis de Bell, que causa parlisis facial debido a la inflamacin del nervio facial.  Esto podra haber sido provocado por su herpes labial. Tome los esteroides diariamente con el desayuno. Use Valtrex 3 veces al da durante la siguiente semana. Puede usar lgrimas artificiales cada 4 horas para ayudar a Counselling psychologist.  Con tratamiento, la [employer] se recuperan completamente a los 6 meses. Si los sntomas persisten, consulte con su mdico de cabecera para una evaluacin adicional. Busque atencin mdica inmediata ante cualquier sntoma nuevo o preocupante.  Your examination is consistent with Bell's palsy, this causes facial paralysis due to inflammation of the facial nerve.  This may have been triggered by your cold sore.  Take the steroids daily with breakfast.  Use the Valtrex 3 times daily for the next week.  You can use the artificial tears every 4 hours to help provide lubrication to the eye.  With treatment, most patients fully recover by 6 months.  If your symptoms persist please follow-up with the primary care provider for further evaluation.  Seek immediate care for any new or concerning symptoms.    ED Prescriptions     Medication Sig Dispense Auth. Provider   predniSONE (DELTASONE) 20 MG tablet Take 3 tablets (60 mg total) by mouth daily for 7 days. 21 tablet Harlow Lighter, Roux Brandy  N, FNP   valACYclovir (VALTREX) 1000 MG tablet Take 1 tablet (1,000 mg total) by mouth 3 (three) times daily for 7 days. 21 tablet Harlow Lighter, Nahom Carfagno  N, FNP   hydroxypropyl methylcellulose / hypromellose (ISOPTO TEARS / GONIOVISC) 2.5 % ophthalmic solution Place 1 drop into the left eye 4 (four) times daily as needed for dry eyes. 15 mL Harlow Lighter, Zedrick Springsteen  N, FNP      PDMP not reviewed this encounter.   Harlow Lighter, Earmon Sherrow  N, FNP 04/15/24 1408

## 2024-04-22 ENCOUNTER — Ambulatory Visit (HOSPITAL_COMMUNITY)
Admission: EM | Admit: 2024-04-22 | Discharge: 2024-04-22 | Disposition: A | Discharge status: Home / Self Care | Payer: Self-pay | Attending: Physician Assistant | Admitting: Physician Assistant

## 2024-04-22 ENCOUNTER — Encounter (HOSPITAL_COMMUNITY): Payer: Self-pay | Admitting: Emergency Medicine

## 2024-04-22 ENCOUNTER — Other Ambulatory Visit: Payer: Self-pay

## 2024-04-22 DIAGNOSIS — G510 Bell's palsy: Secondary | ICD-10-CM

## 2024-04-22 MED ORDER — PREDNISONE 10 MG (21) PO TBPK: ORAL_TABLET | ORAL | 0 refills | Status: AC

## 2024-04-22 MED ORDER — HYPROMELLOSE (GONIOSCOPIC) 2.5 % OP SOLN: 1.0000 [drp] | Freq: Four times a day (QID) | OPHTHALMIC | 1 refills | Status: AC | PRN

## 2024-04-22 MED ORDER — SYSTANE NIGHTTIME OP OINT: 1.0000 | TOPICAL_OINTMENT | Freq: Every day | OPHTHALMIC | 1 refills | Status: AC

## 2024-04-22 MED ORDER — VALACYCLOVIR HCL 1 G PO TABS: 1000.0000 mg | ORAL_TABLET | Freq: Three times a day (TID) | ORAL | 0 refills | Status: AC

## 2024-04-22 NOTE — ED Triage Notes (Signed)
 Pt report left side facial droop for over a week. Was seen here for same and states medications are not helping him.

## 2024-04-22 NOTE — ED Provider Notes (Signed)
 [facility]    CSN: [id number] Arrival date & time: 04/22/24  1016      History   Chief Complaint Chief Complaint  Patient presents with   Facial Droop    HPI Jonathan Hampton is a 33 y.o. male.   Patient presents today for reevaluation of Bell's palsy.  He is accompanied by his father who provided translation after declining video interpreter.  Reports that he had a cold sore on the inside of his mouth beginning 04/13/2024 and then developed left-sided facial droop.  He was seen by our clinic on 04/15/2024 at which point he was prescribed valacyclovir  and prednisone .  When he went to the pharmacy he was able to pick up the prednisone  but was never given the valacyclovir .  He has been using lubricating eyedrops.  He reports that he feels there is some movement in the left side of his face but he continues to have facial asymmetry.  He denies any visual disturbance or ocular pain and has been compliant with lubricating eyedrops.  He denies any new symptoms including rash, fever, nausea, vomiting, dysphagia, dysarthria, additional weakness.    Past Medical History:  Diagnosis Date   Ruptured appendicitis 05/16/2020    Patient Active Problem List   Diagnosis Date Noted   Perforated appendicitis 05/16/2020    Past Surgical History:  Procedure Laterality Date   IR RADIOLOGIST EVAL & MGMT  06/05/2020   LAPAROSCOPIC APPENDECTOMY N/A 08/05/2020   Procedure: APPENDECTOMY LAPAROSCOPIC;  Surgeon: Shela Derby, MD;  Location: [facility] OR;  Service: General;  Laterality: N/A;       Home Medications    Prior to Admission medications   Medication Sig Start Date End Date Taking? Authorizing Provider  predniSONE  (STERAPRED UNI-PAK 21 TAB) 10 MG (21) TBPK tablet As directed 04/22/24  Yes Nastasia Kage K, PA-C  White Petrolatum-Mineral Oil (SYSTANE NIGHTTIME) OINT Apply 1 Application to eye at bedtime. 04/22/24  Yes Chivon Lepage K, PA-C  hydroxypropyl methylcellulose /  hypromellose (ISOPTO TEARS / GONIOVISC) 2.5 % ophthalmic solution Place 1 drop into the left eye 4 (four) times daily as needed for dry eyes. 04/22/24   Andrianna Manalang K, PA-C  valACYclovir  (VALTREX ) 1000 MG tablet Take 1 tablet (1,000 mg total) by mouth 3 (three) times daily for 7 days. 04/22/24 04/29/24  Darrin Apodaca, Betsey Brow, PA-C    Family History History reviewed. No pertinent family history.  Social History Social History   Tobacco Use   Smoking status: Some Days    Types: Cigarettes   Smokeless tobacco: Never   Tobacco comments:    "not much"  Vaping Use   Vaping status: Never Used  Substance Use Topics   Alcohol use: Not Currently    Comment: occasionally   Drug use: Not Currently    Comment: pt denies     Allergies   Patient has no known allergies.   Review of Systems Review of Systems  Constitutional:  Negative for activity change, appetite change, fatigue and fever.  HENT:  Negative for trouble swallowing.   Eyes:  Negative for photophobia, pain, discharge, redness, itching and visual disturbance.  Gastrointestinal:  Negative for abdominal pain, diarrhea, nausea and vomiting.  Skin:  Negative for rash and wound.  Neurological:  Positive for facial asymmetry. Negative for dizziness, syncope, speech difficulty, weakness, light-headedness, numbness and headaches.     Physical Exam Triage Vital Signs ED Triage Vitals  Encounter Vitals Group     BP 04/22/24 1056 121/71  Systolic BP Percentile --      Diastolic BP Percentile --      Pulse Rate 04/22/24 1056 70     Resp 04/22/24 1056 18     Temp 04/22/24 1056 98.2 F (36.8 C)     Temp Source 04/22/24 1056 Oral     SpO2 04/22/24 1056 98 %     Weight --      Height --      Head Circumference --      Peak Flow --      Pain Score 04/22/24 1057 0     Pain Loc --      Pain Education --      Exclude from Growth Chart --    No data found.  Updated Vital Signs BP 121/71 (BP Location: Right Arm)   Pulse 70   Temp  98.2 F (36.8 C) (Oral)   Resp 18   SpO2 98%   Visual Acuity Right Eye Distance:   Left Eye Distance:   Bilateral Distance:    Right Eye Near:   Left Eye Near:    Bilateral Near:     Physical Exam Vitals reviewed.  Constitutional:      General: He is awake.     Appearance: Normal appearance. He is well-developed. He is not ill-appearing.     Comments: Very pleasant male presented age no acute distress sitting comfortably in exam room  HENT:     Head: Normocephalic and atraumatic.     Right Ear: Tympanic membrane, ear canal and external ear normal.     Left Ear: Tympanic membrane, ear canal and external ear normal.     Nose: Nose normal.     Mouth/Throat:     Tongue: Tongue does not deviate from midline.     Pharynx: Uvula midline. No oropharyngeal exudate or posterior oropharyngeal erythema.  Eyes:     Extraocular Movements: Extraocular movements intact.     Pupils: Pupils are equal, round, and reactive to light.  Cardiovascular:     Rate and Rhythm: Normal rate and regular rhythm.     Heart sounds: Normal heart sounds, S1 normal and S2 normal. No murmur heard. Pulmonary:     Effort: Pulmonary effort is normal. No accessory muscle usage or respiratory distress.     Breath sounds: Normal breath sounds. No stridor. No wheezing, rhonchi or rales.     Comments: Clear to auscultation bilaterally Musculoskeletal:     Comments: Strength 5/5 bilateral upper and lower extremities  Skin:    Findings: No rash.  Neurological:     General: No focal deficit present.     Mental Status: He is alert and oriented to person, place, and time.     Cranial Nerves: Cranial nerve deficit and facial asymmetry present. No dysarthria.     Motor: Motor function is intact.     Coordination: Coordination is intact.     Gait: Gait is intact.     Comments: Left-sided cranial nerve VII palsy with flattening of nasolabial fold, inability to raise eyebrows on the left, inability to completely close eye  on left.  Psychiatric:        Behavior: Behavior is cooperative.      UC Treatments / Results  Labs (all labs ordered are listed, but only abnormal results are displayed) Labs Reviewed - No data to display  EKG   Radiology No results found.  Procedures Procedures (including critical care time)  Medications Ordered in UC Medications - No data  to display  Initial Impression / Assessment and Plan / UC Course  I have reviewed the triage vital signs and the nursing notes.  Pertinent labs & imaging results that were available during my care of the patient were reviewed by me and considered in my medical decision making (see chart for details).     Patient is well-appearing, afebrile, nontoxic.  We discussed that symptoms are consistent with persistent Bell's palsy and that this can take several months before it resolves.  Will add prednisone  taper and he was instructed not to take NSAIDs with this medication due to risk of GI bleeding.  He was never able to pick up the Valtrex  and we discussed that it is less likely help since it has been a week since his symptoms began, however, we will start this medication in the hopes that it might provide some additional relief.  He denies any ocular pain, visual disturbance, foreign body sensation, discussed the importance of protecting his eye including using lubricating eyedrops and ophthalmic ointment at night.  If he has any eye discomfort he is to return for reevaluation.  Given persistent symptoms I did recommend he follow-up with neurology and he was given the contact information for local provider with instruction to call to schedule appointment.  We discussed that if anything worsens he has additional symptoms, fever, dysphagia, rash he needs to be seen immediately.  Strict return precautions given.  All questions answered to his satisfaction.  Final Clinical Impressions(s) / UC Diagnoses   Final diagnoses:  Bell's palsy     Discharge  Instructions      It can take 3 to 4 months before Bell's palsy goes away.  I am hopeful that this will continue to improve.  Take prednisone  as prescribed.  Do not take NSAIDs with this medication including aspirin, ibuprofen/Advil, naproxen/Aleve.  Start Valtrex  3 times a day for 7 days.  Continue using eyedrops throughout the day and use nighttime ointment at night to help protect the eye.  If you have any eye irritation, eye pain, vision change you need to be seen immediately.  I recommend following up with a neurologist.  Call them to schedule an appointment.  If anything changes or worsens please return for reevaluation.  La parlisis de Bell puede tardar de 3 a 4 meses en desaparecer. Espero que siga mejorando. Tome prednisona segn lo prescrito. No tome AINE con este medicamento, como aspirina, ibuprofeno/Advil, naproxeno/Aleve. Comience con Valtrex  3 veces al da durante [address] Contine usando gotas para los ojos [employer] y use ungento nocturno para Engineer, technical sales. Si presenta irritacin o dolor ocular o cambios en la visin, debe ser examinado de inmediato. Recomiendo consultar con un neurlogo. Llmelo para programar una cita. Si algo cambia o empeora, por favor, regrese para una reevaluacin.  ED Prescriptions     Medication Sig Dispense Auth. Provider   predniSONE  (STERAPRED UNI-PAK 21 TAB) 10 MG (21) TBPK tablet As directed 21 tablet Kaelon Weekes K, PA-C   valACYclovir  (VALTREX ) 1000 MG tablet Take 1 tablet (1,000 mg total) by mouth 3 (three) times daily for 7 days. 21 tablet Cedra Villalon K, PA-C   hydroxypropyl methylcellulose / hypromellose (ISOPTO TEARS / GONIOVISC) 2.5 % ophthalmic solution Place 1 drop into the left eye 4 (four) times daily as needed for dry eyes. 15 mL Shey Bartmess K, PA-C   White Petrolatum-Mineral Oil (SYSTANE NIGHTTIME) OINT Apply 1 Application to eye at bedtime. 3.5 g Saiya Crist K, PA-C  PDMP not reviewed this encounter.   Budd Cargo,  PA-C 04/22/24 1140

## 2024-04-22 NOTE — Discharge Instructions (Signed)
 It can take 3 to 4 months before Bell's palsy goes away.  I am hopeful that this will continue to improve.  Take prednisone  as prescribed.  Do not take NSAIDs with this medication including aspirin, ibuprofen/Advil, naproxen/Aleve.  Start Valtrex  3 times a day for 7 days.  Continue using eyedrops throughout the day and use nighttime ointment at night to help protect the eye.  If you have any eye irritation, eye pain, vision change you need to be seen immediately.  I recommend following up with a neurologist.  Call them to schedule an appointment.  If anything changes or worsens please return for reevaluation.  La parlisis de Bell puede tardar de 3 a 4 meses en desaparecer. Espero que siga mejorando. Tome prednisona segn lo prescrito. No tome AINE con este medicamento, como aspirina, ibuprofeno/Advil, naproxeno/Aleve. Comience con Valtrex  3 veces al da durante [address] Contine usando gotas para los ojos [employer] y use ungento nocturno para [employer], technical sales. Si presenta irritacin o dolor ocular o cambios en la visin, debe ser examinado de inmediato. Recomiendo consultar con un neurlogo. Llmelo para programar una cita. Si algo cambia o empeora, por favor, regrese para una reevaluacin.
